# Patient Record
Sex: Male | Born: 1985 | Race: White | Hispanic: No | Marital: Married | Smoking: Never smoker
Health system: Southern US, Community
[De-identification: ages and names within clinical notes are randomized; demographics above are authoritative.]

## PROBLEM LIST (undated history)

## (undated) DIAGNOSIS — M1990 Unspecified osteoarthritis, unspecified site: Secondary | ICD-10-CM

## (undated) DIAGNOSIS — K5190 Ulcerative colitis, unspecified, without complications: Secondary | ICD-10-CM

## (undated) HISTORY — DX: Ulcerative colitis, unspecified, without complications: K51.90

## (undated) HISTORY — PX: ANKLE FRACTURE SURGERY: SHX122

## (undated) HISTORY — DX: Unspecified osteoarthritis, unspecified site: M19.90

---

## 2016-10-27 ENCOUNTER — Encounter: Payer: Self-pay | Admitting: Gastroenterology

## 2016-10-27 ENCOUNTER — Ambulatory Visit (INDEPENDENT_AMBULATORY_CARE_PROVIDER_SITE_OTHER): Payer: 59 | Admitting: Family

## 2016-10-27 ENCOUNTER — Other Ambulatory Visit (INDEPENDENT_AMBULATORY_CARE_PROVIDER_SITE_OTHER): Payer: 59

## 2016-10-27 ENCOUNTER — Encounter: Payer: Self-pay | Admitting: Family

## 2016-10-27 VITALS — BP 124/80 | HR 83 | Temp 98.3°F | Resp 16 | Ht 70.0 in | Wt 241.0 lb

## 2016-10-27 DIAGNOSIS — E559 Vitamin D deficiency, unspecified: Secondary | ICD-10-CM | POA: Diagnosis not present

## 2016-10-27 DIAGNOSIS — K5190 Ulcerative colitis, unspecified, without complications: Secondary | ICD-10-CM

## 2016-10-27 LAB — VITAMIN D 25 HYDROXY (VIT D DEFICIENCY, FRACTURES): VITD: 40.34 ng/mL (ref 30.00–100.00)

## 2016-10-27 LAB — COMPREHENSIVE METABOLIC PANEL
ALT: 20 U/L (ref 0–53)
AST: 17 U/L (ref 0–37)
Albumin: 4.4 g/dL (ref 3.5–5.2)
Alkaline Phosphatase: 65 U/L (ref 39–117)
BUN: 12 mg/dL (ref 6–23)
CALCIUM: 9.5 mg/dL (ref 8.4–10.5)
CHLORIDE: 103 meq/L (ref 96–112)
CO2: 29 meq/L (ref 19–32)
CREATININE: 0.95 mg/dL (ref 0.40–1.50)
GFR: 96.72 mL/min (ref >60.00)
GLUCOSE: 94 mg/dL (ref 70–99)
Potassium: 3.9 mEq/L (ref 3.5–5.1)
SODIUM: 138 meq/L (ref 135–145)
Total Bilirubin: 0.5 mg/dL (ref 0.2–1.2)
Total Protein: 7.3 g/dL (ref 6.0–8.3)

## 2016-10-27 MED ORDER — MESALAMINE 1.2 G PO TBEC: 2.4000 g | DELAYED_RELEASE_TABLET | Freq: Every day | ORAL | 0 refills | Status: DC

## 2016-10-27 MED FILL — LIALDA 1.2 GM TABLET SA: 1.2 | 90 days supply | Qty: 180 | Fill #0

## 2016-10-27 NOTE — Progress Notes (Signed)
Subjective:    Patient ID: Alan Jimenez, male    DOB: Nov 19, 1985, 31 y.o.   MRN: [id number]  Chief Complaint  Patient presents with  . Establish Care    Ulcerative colitis needs medication refilled for lialda, referral to GI    HPI:  Alan Jimenez is a 31 y.o. male who  has a past medical history of Arthritis and Ulcerative colitis ([place]). and presents today for an office visit to establish care.   Ulcerative colitis - Diagnosed in 2013 when having weight loss and bloody diarrhea for about 6 months when diagnosed via colonoscopy. No recent flares or current symptoms. Initially started on Humeria which was working well, but was switched to [place] secondary to his arthritis located in his costovertebral joints. Currently maintained on Lialda and reports taking the medication as prescribed and denies adverse side effects. Has also had borderline Vitamin D levels. 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 No current nausea, vomiting, diarrhea, constipation or blood in stool.    No Known Allergies    No outpatient prescriptions prior to visit.   No facility-administered medications prior to visit.      Past Medical History:  Diagnosis Date  . Arthritis   . Ulcerative colitis [facility])       Past Surgical History:  Procedure Laterality Date  . ANKLE FRACTURE SURGERY        Family History  Problem Relation Age of Onset  . Healthy Mother   . Healthy Father   . Healthy Maternal Grandmother   . Healthy Maternal Grandfather   . Healthy Paternal Grandmother   . Pulmonary fibrosis Paternal Grandfather       Social History   Social History  . Marital status: Married    Spouse name: N/A  . Number of children: 2  . Years of education: 71   Occupational History  . Internal Medicine Resident    Social History Main Topics  . Smoking status: Never Smoker  . Smokeless tobacco: Never Used  .  Alcohol use No  . Drug use: No  . Sexual activity: Yes   Other Topics Concern  . Not on file   Social History Narrative   Fun/Hobby: Hanging out with the kids; recently started running.      Review of Systems  Constitutional: Negative for chills and fever.  Respiratory: Negative for chest tightness, shortness of breath and wheezing.   Gastrointestinal: Negative for abdominal distention, abdominal pain, anal bleeding, blood in stool, constipation, diarrhea, nausea, rectal pain and vomiting.       Objective:    BP 124/80 (BP Location: Left Arm, Patient Position: Sitting, Cuff Size: Large)   Pulse 83   Temp 98.3 F (36.8 C) (Oral)   Resp 16   Ht 5' 10"  (1.778 m)   Wt 241 lb (109.3 kg)   SpO2 98%   BMI 34.58 kg/m  Nursing note and vital signs reviewed.  Physical Exam  Constitutional: He is oriented to person, place, and time. He appears well-developed and well-nourished. No distress.  Cardiovascular: Normal rate, regular rhythm, normal heart sounds and intact distal pulses.   Pulmonary/Chest: Effort normal and breath sounds normal.  Abdominal: Soft. Bowel sounds are normal. He exhibits no distension and no mass. There is no tenderness. There is no rebound and no guarding.  Neurological: He is alert and oriented to person, place, and time.  Skin: Skin is warm and dry.  Psychiatric: He has a normal mood and affect. His behavior is normal. Judgment and thought content normal.        Assessment & Plan:   Problem List Items Addressed This Visit      Digestive   Ulcerative colitis ([place]) - Primary    Previously diagnosed with ulcerative colitis and currently maintained on mesalamine with no adverse side effects. No symptoms of exacerbation. Refer to gastroenterology for further maintenance. Continue current dosage of mesalamine. Follow-up as needed for flares.      Relevant Orders   Comprehensive metabolic panel (Completed)   Ambulatory referral to Gastroenterology      Other   Vitamin D deficiency    Previously diagnosed with vitamin D deficiency and currently maintained on oral vitamin D supplementation. Obtain vitamin D levels. Continue current supplementation pending blood work results.      Relevant Orders   VITAMIN D 25 Hydroxy (Vit-D Deficiency, Fractures) (Completed)       I have changed Mr. Alan Jimenez's mesalamine.   Meds ordered this encounter  Medications  . DISCONTD: mesalamine (LIALDA) 1.2 g EC tablet    Sig: Take 2.4 g by mouth daily with breakfast.   . mesalamine (LIALDA) 1.2 g EC tablet    Sig: Take 2 tablets (2.4 g total) by mouth daily.    Dispense:  180 tablet    Refill:  0    Order Specific Question:   Supervising Provider    Answer:   Pricilla Holm A [[id number]]     Follow-up: Return in about 6 months (around 04/29/2017), or if symptoms worsen or fail to improve.  Mauricio Po, FNP

## 2016-10-27 NOTE — Assessment & Plan Note (Signed)
Previously diagnosed with vitamin D deficiency and currently maintained on oral vitamin D supplementation. Obtain vitamin D levels. Continue current supplementation pending blood work results.

## 2016-10-27 NOTE — Assessment & Plan Note (Signed)
Previously diagnosed with ulcerative colitis and currently maintained on mesalamine with no adverse side effects. No symptoms of exacerbation. Refer to gastroenterology for further maintenance. Continue current dosage of mesalamine. Follow-up as needed for flares.

## 2016-10-27 NOTE — Patient Instructions (Addendum)
Thank you for choosing [facility].  SUMMARY AND INSTRUCTIONS:  Welcome to the area!  Please continue to take your medications as prescribed.  You will receive a call to schedule your appointment with gastroenterology.  Schedule at time for your physical at your convenience.   Medication:  Your prescription(s) have been submitted to your pharmacy or been printed and provided for you. Please take as directed and contact our office if you believe you are having problem(s) with the medication(s) or have any questions.  Labs:  Please stop by the lab on the lower level of the building for your blood work. Your results will be released to [place] (or called to you) after review, usually within 72 hours after test completion. If any changes need to be made, you will be notified at that same time.  1.) The lab is open from 7:30am to 5:30 pm Monday-Friday 2.) No appointment is necessary 3.) Fasting (if needed) is 6-8 hours after food and drink; black coffee and water are okay   Follow up:  If your symptoms worsen or fail to improve, please contact our office for further instruction, or in case of emergency go directly to the emergency room at the closest medical facility.

## 2016-11-10 ENCOUNTER — Ambulatory Visit: Payer: Self-pay | Admitting: Family Medicine

## 2016-12-14 ENCOUNTER — Telehealth: Payer: Self-pay | Admitting: Gastroenterology

## 2016-12-14 NOTE — Telephone Encounter (Signed)
ROI faxed to [facility]

## 2017-01-03 ENCOUNTER — Ambulatory Visit (INDEPENDENT_AMBULATORY_CARE_PROVIDER_SITE_OTHER): Payer: 59 | Admitting: Gastroenterology

## 2017-01-03 ENCOUNTER — Encounter: Payer: Self-pay | Admitting: Gastroenterology

## 2017-01-03 VITALS — BP 128/86 | Ht 70.0 in | Wt 241.0 lb

## 2017-01-03 DIAGNOSIS — K5100 Ulcerative (chronic) pancolitis without complications: Secondary | ICD-10-CM | POA: Diagnosis not present

## 2017-01-03 MED ORDER — MESALAMINE 1.2 G PO TBEC: 2.4000 g | DELAYED_RELEASE_TABLET | Freq: Every day | ORAL | 5 refills | Status: DC

## 2017-01-03 NOTE — Patient Instructions (Addendum)
The orders for your lab work is in our system. If you decide to have them drawn, please go directly to our basement at your convenience.   We have sent the following medications to your pharmacy for you to pick up at your convenience: [facility].  Dr. Amil Amen and Dr. Max Sane [facility] [place]. [address] [place], [place] [address] Phone: [phone] Fax: [phone]  Thank you for choosing me and [facility].  Pricilla Riffle. Dagoberto Ligas., MD., Marval Regal

## 2017-01-03 NOTE — Progress Notes (Signed)
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 Unfortunately I don't have prior medical records. He states he was diagnosed with UC involving most of his colon in December 2013. His symptoms began in May 2013 and included bloody diarrhea weight loss lower abdominal pain. He is also lactose intolerant and states he has several other food intolerances that lead to diarrhea. He states he was treated with Humira for about 2 years and then [facility] for one year. 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 He has been maintained on Lialda. Biologics were prescribed by his rheumatologist in conjunction with his gastroenterologist primarily for spine related arthritis symptoms. States he has sacroiliitis and his HLA-B27 was negative. States his only colonoscopy was performed in December 2013 at the time of diagnosis. Denies weight loss, abdominal pain, constipation, diarrhea, change in stool caliber, melena, hematochezia, nausea, vomiting, dysphagia, reflux symptoms, chest pain.   Allergies  Allergen Reactions  . Penicillins Rash    As child   Outpatient Medications Prior to Visit  Medication Sig Dispense Refill  . mesalamine (LIALDA) 1.2 g EC tablet Take 2 tablets (2.4 g total) by mouth daily. 180 tablet 0   No facility-administered medications prior to visit.    Past Medical History:  Diagnosis Date  . Arthritis   . Ulcerative colitis [facility])    Past Surgical History:  Procedure Laterality Date  . ANKLE FRACTURE SURGERY     Social History   Social History  . Marital status: Married    Spouse name: N/A  . Number of children: 2  . Years of education: 73   Occupational History  . Internal Medicine Resident    Social History Main Topics  . Smoking status: Never Smoker  . Smokeless tobacco: Never Used  . Alcohol use No  .  Drug use: No  . Sexual activity: Yes   Other Topics Concern  . None   Social History Narrative   Fun/Hobby: Hanging out with the kids; recently started running.    Family History  Problem Relation Age of Onset  . Healthy Mother   . Healthy Father   . Healthy Maternal Grandmother   . Healthy Maternal Grandfather   . Healthy Paternal Grandmother   . Pulmonary fibrosis Paternal Grandfather       Review of Systems: Pertinent positive and negative review of systems were noted in the above HPI section. All other review of systems were otherwise negative.    Physical Exam: General: Well developed, well nourished, no acute distress Head: Normocephalic and atraumatic Eyes:  sclerae anicteric, EOMI Ears: Normal auditory acuity Mouth: No deformity or lesions Neck: Supple, no masses or thyromegaly Lungs: Clear throughout to auscultation Heart: Regular rate and rhythm; no murmurs, rubs or bruits Abdomen: Soft, non tender and non distended. No masses, hepatosplenomegaly or hernias noted. Normal Bowel sounds Rectal: not done Musculoskeletal: Symmetrical with no gross deformities  Skin: No lesions on visible extremities Pulses:  Normal pulses noted Extremities: No clubbing, cyanosis, edema or deformities noted Neurological: Alert oriented x 4, grossly nonfocal Cervical Nodes:  No significant cervical adenopathy Inguinal Nodes: No significant inguinal adenopathy Psychological:  Alert and cooperative. Normal mood and affect  Assessment and Recommendations:  1. Ulcerative colitis diagnosed in 2013, pancolitis by his history. Symptoms currently controlled on Lialda. Renew Lialda 2.4 g daily. CMP  in Aug 2018 was normal. Recommended CBC, ESR and CRP and he will check his insurance for coverage before obtaining. Advised to obtain tTG and IgA if this has not been done in the past few years and will await review of records before ordering. Recommended that he obtain rheumatologist in [place]  and he will consider this. Provided contact information for Drs. Amil Amen and [place]. I recommended colonoscopy to assess disease activity and he will consider this. Advised to call for any flare of symptoms. REV in 6 months.   2. Lactose intolerance and other food intolerances. Continue current avoidance strategy.   cc: [facility], [place] [address] [place], [place] [address]

## 2017-02-14 MED FILL — LIALDA 1.2 GM TABLET SA: 1.2 | 90 days supply | Qty: 180 | Fill #0

## 2017-02-21 NOTE — Telephone Encounter (Signed)
No records from [place]

## 2017-04-19 ENCOUNTER — Telehealth: Payer: Self-pay | Admitting: Pulmonary Disease

## 2017-04-19 MED ORDER — CIPROFLOXACIN HCL 500 MG PO TABS: 500.0000 mg | ORAL_TABLET | Freq: Two times a day (BID) | ORAL | 0 refills | Status: DC

## 2017-04-19 MED FILL — CIPROFLOXACIN HCL 500 MG TA: 500 | 2 days supply | Qty: 4 | Fill #0

## 2017-04-19 NOTE — Telephone Encounter (Signed)
Involved in care of patient with possible meningococcal meningitis.  Denies current symptoms of cough, headache, fever, rash, dyspnea, abdominal pain, chest pain.  Will send script for cipro for prophylaxis.

## 2017-05-04 MED FILL — LIALDA 1.2 GM TABLET SA: 1.2 | 90 days supply | Qty: 180 | Fill #1

## 2017-07-26 ENCOUNTER — Ambulatory Visit (INDEPENDENT_AMBULATORY_CARE_PROVIDER_SITE_OTHER): Payer: 59 | Admitting: Gastroenterology

## 2017-07-26 ENCOUNTER — Encounter: Payer: Self-pay | Admitting: Gastroenterology

## 2017-07-26 ENCOUNTER — Other Ambulatory Visit (INDEPENDENT_AMBULATORY_CARE_PROVIDER_SITE_OTHER): Payer: 59

## 2017-07-26 ENCOUNTER — Encounter

## 2017-07-26 VITALS — BP 136/86 | HR 88 | Ht 70.0 in | Wt 235.8 lb

## 2017-07-26 DIAGNOSIS — K5100 Ulcerative (chronic) pancolitis without complications: Secondary | ICD-10-CM

## 2017-07-26 LAB — COMPREHENSIVE METABOLIC PANEL
ALBUMIN: 4.3 g/dL (ref 3.5–5.2)
ALT: 20 U/L (ref 0–53)
AST: 17 U/L (ref 0–37)
Alkaline Phosphatase: 91 U/L (ref 39–117)
BUN: 15 mg/dL (ref 6–23)
CALCIUM: 9.2 mg/dL (ref 8.4–10.5)
CO2: 27 mEq/L (ref 19–32)
Chloride: 103 mEq/L (ref 96–112)
Creatinine, Ser: 1.12 mg/dL (ref 0.40–1.50)
GFR: 79.63 mL/min (ref >60.00)
Glucose, Bld: 88 mg/dL (ref 70–99)
POTASSIUM: 3.8 meq/L (ref 3.5–5.1)
SODIUM: 140 meq/L (ref 135–145)
Total Bilirubin: 0.4 mg/dL (ref 0.2–1.2)
Total Protein: 7.1 g/dL (ref 6.0–8.3)

## 2017-07-26 LAB — CBC WITH DIFFERENTIAL/PLATELET
BASOS ABS: 0.1 10*3/uL (ref 0.0–0.1)
Basophils Relative: 0.8 % (ref 0.0–3.0)
EOS ABS: 0.1 10*3/uL (ref 0.0–0.7)
EOS PCT: 2.2 % (ref 0.0–5.0)
HCT: 40.3 % (ref 39.0–52.0)
HEMOGLOBIN: 14 g/dL (ref 13.0–17.0)
Lymphocytes Relative: 24.5 % (ref 12.0–46.0)
Lymphs Abs: 1.6 10*3/uL (ref 0.7–4.0)
MCHC: 34.8 g/dL (ref 30.0–36.0)
MCV: 81.9 fl (ref 78.0–100.0)
MONO ABS: 0.5 10*3/uL (ref 0.1–1.0)
Monocytes Relative: 8.4 % (ref 3.0–12.0)
NEUTROS PCT: 64.1 % (ref 43.0–77.0)
Neutro Abs: 4.1 10*3/uL (ref 1.4–7.7)
Platelets: 228 10*3/uL (ref 150.0–400.0)
RBC: 4.91 Mil/uL (ref 4.22–5.81)
RDW: 13.8 % (ref 11.5–15.5)
WBC: 6.4 10*3/uL (ref 4.0–10.5)

## 2017-07-26 LAB — SEDIMENTATION RATE: SED RATE: 13 mm/h (ref 0–15)

## 2017-07-26 LAB — C-REACTIVE PROTEIN: CRP: 0.4 mg/dL — AB (ref 0.5–20.0)

## 2017-07-26 NOTE — Patient Instructions (Addendum)
Your provider has requested that you go to the basement level for lab work before leaving today. Press "B" on the elevator. The lab is located at the first door on the left as you exit the elevator.   We will fax records to there office and they will contact you with an appointment date and time.  Dr. Amil Amen and Dr. Max Sane [facility] [place]. [address] [place], [place] [address] Phone: [phone] Fax: [phone]  Normal BMI (Body Mass Index- based on height and weight) is between 19 and 25. Your BMI today is Body mass index is 33.83 kg/m. Marland Kitchen Please consider follow up  regarding your BMI with your Primary Care Provider.  Thank you for choosing me and [facility].  Pricilla Riffle. Dagoberto Ligas., MD., Marval Regal

## 2017-07-26 NOTE — Progress Notes (Signed)
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  He was diagnosed in [place], [place] with UC in December 2014.  Pathology showed moderate to severe colitis c/w IBD favoring UC. Symptoms began in May 2014.  He was treated with Humira initially for about 2 years and then Simponi for about 1 year.  His colitis symptoms have been under excellent control.  He has no gastrointestinal complaints.  His only complaints are of low back pain right greater than left.  Symptoms do not respond fully to NSAIDs.  He wants to pursue rheumatology evaluation.  Current Medications, Allergies, Past Medical History, Past Surgical History, Family History and Social History were reviewed in [facility] record.  Physical Exam: General: Well developed, well nourished, no acute distress Head: Normocephalic and atraumatic Eyes:  sclerae anicteric, EOMI Ears: Normal auditory acuity Mouth: No deformity or lesions Lungs: Clear throughout to auscultation Heart: Regular rate and rhythm; no murmurs, rubs or bruits Abdomen: Soft, non tender and non distended. No masses, hepatosplenomegaly or hernias noted. Normal Bowel sounds Musculoskeletal: Symmetrical with no gross deformities  Pulses:  Normal pulses noted Extremities: No clubbing, cyanosis, edema or deformities noted Neurological: Alert oriented x 4, grossly nonfocal Psychological:  Alert and cooperative. Normal mood and affect  Assessment and Recommendations:  1.  Ulcerative colitis, pancolitis by history, diagnosed in 2014.  Prior treatment with Humira and Simponi.  Low back pain currently only complaint. Continue Lialda 2.4 g po qd. CBC, CMP, ESR, CRP today. Rheumatology referral. REV in 6 months.

## 2017-07-27 MED FILL — LIALDA 1.2 GM TABLET SA: 1.2 | 90 days supply | Qty: 180 | Fill #2

## 2017-07-28 ENCOUNTER — Telehealth: Payer: Self-pay | Admitting: Gastroenterology

## 2017-07-28 NOTE — Telephone Encounter (Signed)
Routed to [facility], Dr. Fuller Plan patient.

## 2017-07-28 NOTE — Telephone Encounter (Signed)
Alan Jimenez from Rheumatology office called stating that she received a referral for pt but is missing her demographics. Pls fax it to [phone], attn. Alan Jimenez.

## 2017-07-28 NOTE — Telephone Encounter (Signed)
Requested demos faxed

## 2017-08-09 NOTE — Telephone Encounter (Signed)
Referral scheduled at [facility] on 09/20/17 at 2:00pm with Gavin Pound, MD. Per their office patient has been notified of appt date and time.

## 2017-08-10 ENCOUNTER — Encounter: Payer: Self-pay | Admitting: Family

## 2017-08-12 ENCOUNTER — Encounter: Payer: Self-pay | Admitting: Family Medicine

## 2017-08-30 ENCOUNTER — Encounter: Payer: Self-pay | Admitting: Family Medicine

## 2017-08-30 ENCOUNTER — Ambulatory Visit (INDEPENDENT_AMBULATORY_CARE_PROVIDER_SITE_OTHER): Payer: 59 | Admitting: Family Medicine

## 2017-08-30 DIAGNOSIS — E669 Obesity, unspecified: Secondary | ICD-10-CM | POA: Diagnosis not present

## 2017-08-30 NOTE — Patient Instructions (Signed)
It was very nice to see you today!  Keep up the good work!!  Come back to see me in 1 year, or sooner as needed.  Take care, Dr Jerline Pain   Preventive Care 18-39 Years, Male Preventive care refers to lifestyle choices and visits with your health care provider that can promote health and wellness. What does preventive care include?  A yearly physical exam. This is also called an annual well check.  Dental exams once or twice a year.  Routine eye exams. Ask your health care provider how often you should have your eyes checked.  Personal lifestyle choices, including: ? Daily care of your teeth and gums. ? Regular physical activity. ? Eating a healthy diet. ? Avoiding tobacco and drug use. ? Limiting alcohol use. ? Practicing safe sex. What happens during an annual well check? The services and screenings done by your health care provider during your annual well check will depend on your age, overall health, lifestyle risk factors, and family history of disease. Counseling Your health care provider may ask you questions about your:  Alcohol use.  Tobacco use.  Drug use.  Emotional well-being.  Home and relationship well-being.  Sexual activity.  Eating habits.  Work and work Statistician.  Screening You may have the following tests or measurements:  Height, weight, and BMI.  Blood pressure.  Lipid and cholesterol levels. These may be checked every 5 years starting at age 69.  Diabetes screening. This is done by checking your blood sugar (glucose) after you have not eaten for a while (fasting).  Skin check.  Hepatitis C blood test.  Hepatitis B blood test.  Sexually transmitted disease (STD) testing.  Discuss your test results, treatment options, and if necessary, the need for more tests with your health care provider. Vaccines Your health care provider may recommend certain vaccines, such as:  Influenza vaccine. This is recommended every year.  Tetanus,  diphtheria, and acellular pertussis (Tdap, Td) vaccine. You may need a Td booster every 10 years.  Varicella vaccine. You may need this if you have not been vaccinated.  HPV vaccine. If you are 63 or younger, you may need three doses over 6 months.  Measles, mumps, and rubella (MMR) vaccine. You may need at least one dose of MMR.You may also need a second dose.  Pneumococcal 13-valent conjugate (PCV13) vaccine. You may need this if you have certain conditions and have not been vaccinated.  Pneumococcal polysaccharide (PPSV23) vaccine. You may need one or two doses if you smoke cigarettes or if you have certain conditions.  Meningococcal vaccine. One dose is recommended if you are age 36-21 years and a first-year college student living in a residence hall, or if you have one of several medical conditions. You may also need additional booster doses.  Hepatitis A vaccine. You may need this if you have certain conditions or if you travel or work in places where you may be exposed to hepatitis A.  Hepatitis B vaccine. You may need this if you have certain conditions or if you travel or work in places where you may be exposed to hepatitis B.  Haemophilus influenzae type b (Hib) vaccine. You may need this if you have certain risk factors.  Talk to your health care provider about which screenings and vaccines you need and how often you need them. This information is not intended to replace advice given to you by your health care provider. Make sure you discuss any questions you have with your health  care provider. Document Released: 05/11/2001 Document Revised: 12/03/2015 Document Reviewed: 01/14/2015 Elsevier Interactive Patient Education  [employer].

## 2017-08-30 NOTE — Progress Notes (Signed)
Subjective:  Alan Jimenez is a 32 y.o. male who presents today for his annual comprehensive physical exam.    HPI:   He has no acute complaints today.   Lifestyle Diet: Restricted diet due to UC.  Exercise: Three times weekly as much as he can.   Depression screen PHQ 2/9 10/27/2016  Decreased Interest 0  Down, Depressed, Hopeless 0  PHQ - 2 Score 0    Health Maintenance Due  Topic Date Due  . HIV Screening  04/08/1998    ROS: Per HPI, otherwise a complete review of systems was negative.   PMH:  The following were reviewed and entered/updated in epic: Past Medical History:  Diagnosis Date  . Arthritis   . Ulcerative colitis [facility])    Patient Active Problem List   Diagnosis Date Noted  . Obesity 08/30/2017  . Ulcerative colitis ([place]) 10/27/2016  . Vitamin D deficiency 10/27/2016   Past Surgical History:  Procedure Laterality Date  . ANKLE FRACTURE SURGERY Left    Family History  Problem Relation Age of Onset  . Healthy Mother   . Healthy Father   . Healthy Maternal Grandmother   . Healthy Maternal Grandfather   . Healthy Paternal Grandmother   . Pulmonary fibrosis Paternal Grandfather   . Colon cancer Neg Hx   . Esophageal cancer Neg Hx   . Pancreatic cancer Neg Hx   . Stomach cancer Neg Hx   . Liver disease Neg Hx    Medications- reviewed and updated Current Outpatient Medications  Medication Sig Dispense Refill  . mesalamine (LIALDA) 1.2 g EC tablet Take 2 tablets (2.4 g total) by mouth daily. 180 tablet 5   No current facility-administered medications for this visit.     Allergies-reviewed and updated Allergies  Allergen Reactions  . Penicillins Rash    As child    Social History   Socioeconomic History  . Marital status: Married    Spouse name: Not on file  . Number of children: 2  . Years of education: 71  . Highest education level: Not on file  Occupational History  . Occupation: Internal Medicine Resident  Social Needs    . Financial resource strain: Not on file  . Food insecurity:    Worry: Not on file    Inability: Not on file  . Transportation needs:    Medical: Not on file    Non-medical: Not on file  Tobacco Use  . Smoking status: Never Smoker  . Smokeless tobacco: Never Used  Substance and Sexual Activity  . Alcohol use: No  . Drug use: No  . Sexual activity: Yes    Partners: Female  Lifestyle  . Physical activity:    Days per week: Not on file    Minutes per session: Not on file  . Stress: Not on file  Relationships  . Social connections:    Talks on phone: Not on file    Gets together: Not on file    Attends religious service: Not on file    Active member of club or organization: Not on file    Attends meetings of clubs or organizations: Not on file    Relationship status: Not on file  Other Topics Concern  . Not on file  Social History Narrative   Fun/Hobby: Hanging out with the kids; recently started running.    Objective:  Physical Exam: BP 132/80 (BP Location: Left Arm, Patient Position: Sitting, Cuff Size: Normal)   Pulse 97  Temp 98.4 F (36.9 C) (Oral)   Ht 5' 10"  (1.778 m)   Wt 236 lb 3.2 oz (107.1 kg)   SpO2 97%   BMI 33.89 kg/m   Body mass index is 33.89 kg/m. Wt Readings from Last 3 Encounters:  08/30/17 236 lb 3.2 oz (107.1 kg)  07/26/17 235 lb 12.8 oz (107 kg)  01/03/17 241 lb (109.3 kg)   Gen: NAD, resting comfortably open care teams HEENT: TMs normal bilaterally. OP clear. No thyromegaly noted.  CV: RRR with no murmurs appreciated Pulm: NWOB, CTAB with no crackles, wheezes, or rhonchi GI: Normal bowel sounds present. Soft, Nontender, Nondistended. MSK: no edema, cyanosis, or clubbing noted Skin: warm, dry Neuro: CN2-12 grossly intact. Strength 5/5 in upper and lower extremities. Reflexes symmetric and intact bilaterally.  Psych: Normal affect and thought content  Assessment/Plan:  Obesity Congratulated patient on recent weight loss.  Encouraged  continued lifestyle modifications.  Follow-up in 1 year.  Preventative Healthcare: Up-to-date on screenings and vaccinations.  Patient Counseling(The following topics were reviewed and/or handout was given):  -Nutrition: Stressed importance of moderation in sodium/caffeine intake, saturated fat and cholesterol, caloric balance, sufficient intake of fresh fruits, vegetables, and fiber.  -Stressed the importance of regular exercise.   -Substance Abuse: Discussed cessation/primary prevention of tobacco, alcohol, or other drug use; driving or other dangerous activities under the influence; availability of treatment for abuse.   -Injury prevention: Discussed safety belts, safety helmets, smoke detector, smoking near bedding or upholstery.   -Sexuality: Discussed sexually transmitted diseases, partner selection, use of condoms, avoidance of unintended pregnancy and contraceptive alternatives.   -Dental health: Discussed importance of regular tooth brushing, flossing, and dental visits.  -Health maintenance and immunizations reviewed. Please refer to Health maintenance section.  Return to care in 1 year for next preventative visit.   Algis Greenhouse. Jerline Pain, MD 08/30/2017 3:43 PM

## 2017-08-30 NOTE — Assessment & Plan Note (Signed)
Congratulated patient on recent weight loss.  Encouraged continued lifestyle modifications.  Follow-up in 1 year.

## 2017-09-13 ENCOUNTER — Encounter: Payer: 59 | Admitting: Family Medicine

## 2017-09-20 ENCOUNTER — Other Ambulatory Visit: Payer: Self-pay | Admitting: Internal Medicine

## 2017-09-20 DIAGNOSIS — K5190 Ulcerative colitis, unspecified, without complications: Secondary | ICD-10-CM | POA: Diagnosis not present

## 2017-09-20 DIAGNOSIS — E669 Obesity, unspecified: Secondary | ICD-10-CM | POA: Diagnosis not present

## 2017-09-20 DIAGNOSIS — Z6835 Body mass index (BMI) 35.0-35.9, adult: Secondary | ICD-10-CM | POA: Diagnosis not present

## 2017-09-20 DIAGNOSIS — M459 Ankylosing spondylitis of unspecified sites in spine: Secondary | ICD-10-CM | POA: Diagnosis not present

## 2017-09-20 DIAGNOSIS — M461 Sacroiliitis, not elsewhere classified: Secondary | ICD-10-CM | POA: Diagnosis not present

## 2017-09-20 DIAGNOSIS — M940 Chondrocostal junction syndrome [Tietze]: Secondary | ICD-10-CM | POA: Diagnosis not present

## 2017-09-20 DIAGNOSIS — R5383 Other fatigue: Secondary | ICD-10-CM | POA: Diagnosis not present

## 2017-09-20 MED ORDER — ADALIMUMAB 40 MG/0.4ML ~~LOC~~ AJKT: 40.0000 mg | AUTO-INJECTOR | SUBCUTANEOUS | 6 refills | Status: DC

## 2017-09-23 MED FILL — HUMIRA PEN 40 MG/0.4ML PNKT: 40 | 28 days supply | Qty: 2 | Fill #0

## 2017-11-03 MED FILL — HUMIRA PEN 40 MG/0.4ML PNKT: 40 | 28 days supply | Qty: 2 | Fill #1

## 2017-11-03 MED FILL — LIALDA 1.2 GM TABLET SA: 1.2 | 90 days supply | Qty: 180 | Fill #3

## 2017-12-07 MED FILL — HUMIRA PEN 40 MG/0.4ML PNKT: 40 | 28 days supply | Qty: 2 | Fill #2

## 2017-12-30 MED FILL — HUMIRA PEN 40 MG/0.4ML PNKT: 40 | 28 days supply | Qty: 2 | Fill #3

## 2018-01-30 ENCOUNTER — Telehealth: Payer: Self-pay | Admitting: Gastroenterology

## 2018-01-30 ENCOUNTER — Other Ambulatory Visit: Payer: Self-pay | Admitting: Gastroenterology

## 2018-01-30 MED ORDER — MESALAMINE 1.2 G PO TBEC: 2.4000 g | DELAYED_RELEASE_TABLET | Freq: Every day | ORAL | 0 refills | Status: DC

## 2018-01-30 MED FILL — HUMIRA PEN 40 MG/0.4ML PNKT: 40 | 28 days supply | Qty: 2 | Fill #4

## 2018-01-30 MED FILL — LIALDA 1.2 GM TABLET SA: 1.2 | 90 days supply | Qty: 180 | Fill #0

## 2018-01-30 NOTE — Telephone Encounter (Signed)
Prescription sent to patient's pharmacy and patient notified to keep appt for further refills.

## 2018-01-31 DIAGNOSIS — M940 Chondrocostal junction syndrome [Tietze]: Secondary | ICD-10-CM | POA: Diagnosis not present

## 2018-01-31 DIAGNOSIS — Z79899 Other long term (current) drug therapy: Secondary | ICD-10-CM | POA: Diagnosis not present

## 2018-01-31 DIAGNOSIS — M459 Ankylosing spondylitis of unspecified sites in spine: Secondary | ICD-10-CM | POA: Diagnosis not present

## 2018-01-31 DIAGNOSIS — Z6834 Body mass index (BMI) 34.0-34.9, adult: Secondary | ICD-10-CM | POA: Diagnosis not present

## 2018-01-31 DIAGNOSIS — K5190 Ulcerative colitis, unspecified, without complications: Secondary | ICD-10-CM | POA: Diagnosis not present

## 2018-01-31 DIAGNOSIS — M461 Sacroiliitis, not elsewhere classified: Secondary | ICD-10-CM | POA: Diagnosis not present

## 2018-01-31 DIAGNOSIS — E669 Obesity, unspecified: Secondary | ICD-10-CM | POA: Diagnosis not present

## 2018-02-27 MED FILL — HUMIRA PEN 40 MG/0.4ML PNKT: 40 | 28 days supply | Qty: 2 | Fill #5

## 2018-03-15 ENCOUNTER — Ambulatory Visit: Payer: 59 | Admitting: Gastroenterology

## 2018-03-16 DIAGNOSIS — R7989 Other specified abnormal findings of blood chemistry: Secondary | ICD-10-CM | POA: Diagnosis not present

## 2018-04-05 MED FILL — HUMIRA PEN 40 MG/0.4ML PNKT: 40 | 28 days supply | Qty: 2 | Fill #6

## 2018-05-03 ENCOUNTER — Telehealth: Payer: Self-pay | Admitting: Pharmacist

## 2018-05-03 MED FILL — HUMIRA PEN 40 MG/0.4ML PNKT: 40 | 28 days supply | Qty: 2 | Fill #0

## 2018-05-03 NOTE — Telephone Encounter (Signed)
Called patient to schedule an appointment for the [facility]. Patient is interested in video visit so I will send him the instructions to schedule the visit.

## 2018-06-05 MED FILL — HUMIRA PEN 40 MG/0.4ML PNKT: 40 | 28 days supply | Qty: 2 | Fill #1

## 2018-06-06 ENCOUNTER — Ambulatory Visit (INDEPENDENT_AMBULATORY_CARE_PROVIDER_SITE_OTHER): Payer: 59 | Admitting: Pharmacist

## 2018-06-06 DIAGNOSIS — Z79899 Other long term (current) drug therapy: Secondary | ICD-10-CM

## 2018-06-06 MED ORDER — ADALIMUMAB 40 MG/0.4ML ~~LOC~~ AJKT: 40.0000 mg | AUTO-INJECTOR | SUBCUTANEOUS | 2 refills | Status: DC

## 2018-06-06 NOTE — Progress Notes (Signed)
   S: Patient is currently taking Humira for ulcerative colitis and ankylosing spondylitis. Patient is managed by Gavin Pound for this.   Adherence: denies any missed doses  Efficacy: works very well for him.  Dosing:   Ulcerative colitis: SubQ (may continue aminosalicylates and/or corticosteroids; if necessary, azathioprine, or mercaptopurine may also be continued): Initial: 160 mg (given as four 40 mg injections on day 1 or given as two 40 mg injections per day over 2 consecutive days), then 80 mg 2 weeks later (day 15). Maintenance: 40 mg every other week beginning day 29. Note: Only continue maintenance dose in patients demonstrating clinical remission by 8 weeks (day 57) of therapy.  Dose adjustments: Renal: no dose adjustments (has not been studied) Hepatic: no dose adjustments (has not been studied)  Screening: TB test: completed  Hepatitis: completed  Monitoring: S/sx of infection: denies CBC: followed by [facility], gets labs at every visit S/sx of hypersensitivity: denies S/sx of malignancy: denies S/sx of heart failure: denies  O:     Lab Results  Component Value Date   WBC 6.4 07/26/2017   HGB 14.0 07/26/2017   HCT 40.3 07/26/2017   MCV 81.9 07/26/2017   PLT 228.0 07/26/2017      Chemistry      Component Value Date/Time   NA 140 07/26/2017 1610   K 3.8 07/26/2017 1610   CL 103 07/26/2017 1610   CO2 27 07/26/2017 1610   BUN 15 07/26/2017 1610   CREATININE 1.12 07/26/2017 1610      Component Value Date/Time   CALCIUM 9.2 07/26/2017 1610   ALKPHOS 91 07/26/2017 1610   AST 17 07/26/2017 1610   ALT 20 07/26/2017 1610   BILITOT 0.4 07/26/2017 1610       A/P: 1. Medication review: Patient currently on Humira for ulcerative colitis and ankylosing spondylitis and is tolerating it well with improved control of both disease states. Reviewed all notes from [facility]. Reviewed the medication with the patient, including the  following: Humira is a TNF blocking agent indicated for ankylosing spondylitis, Crohn's disease, Hidradenitis suppurativa, psoriatic arthritis, plaque psoriasis, ulcerative colitis, and uveitis. Patient educated on purpose, proper use and potential adverse effects of Humira. The most common adverse effects are infections, headache, and injection site reactions. There is the possibility of an increased risk of malignancy but it is not well understood if this increased risk is due to there medication or the disease state. There are rare cases of pancytopenia and aplastic anemia. No recommendations for any changes at this time.   Christella Hartigan, PharmD, BCPS, BCACP, CPP Clinical Pharmacist Practitioner  [phone]

## 2018-08-08 ENCOUNTER — Encounter: Payer: Self-pay | Admitting: Family Medicine

## 2018-08-08 ENCOUNTER — Telehealth (INDEPENDENT_AMBULATORY_CARE_PROVIDER_SITE_OTHER): Payer: 59 | Admitting: Family Medicine

## 2018-08-08 VITALS — Wt 207.0 lb

## 2018-08-08 DIAGNOSIS — K5100 Ulcerative (chronic) pancolitis without complications: Secondary | ICD-10-CM

## 2018-08-08 DIAGNOSIS — R591 Generalized enlarged lymph nodes: Secondary | ICD-10-CM

## 2018-08-08 NOTE — Assessment & Plan Note (Addendum)
Continue Lialda per GI.  He would like to stay off Humira for the time being due to concerns for COVID-19.  He will follow-up with rheumatology soon to discuss when he could go back on this. Would complete above eval and rule out any other underlying infection or malignancy before starting back on humira.

## 2018-08-08 NOTE — Progress Notes (Signed)
    Chief Complaint:  Alan Jimenez is a 33 y.o. male who presents today for a virtual office visit with a chief complaint of lymphadenopathy.   Assessment/Plan:  Lymphadenopathy Given that symptoms been present for 2 months, I think it is reasonable to pursue further work-up at this point.  Will check CBC with differential.  Also check ultrasound of the area.  Ulcerative colitis ([place]) Continue Lialda per GI.  He would like to stay off Humira for the time being due to concerns for COVID-19.  He will follow-up with rheumatology soon to discuss when he could go back on this. Would complete above eval and rule out any other underlying infection or malignancy before starting back on humira.     Subjective:  HPI:  Lymphadenopathy Started about 2 months ago. Located in bilateral axilla but more so in the left. Has had some intentional weight loss over the last couple of months. No fevers, chills, body aches, or night sweats. No recent illness. He is on humira for UC and stopped that a couple of months ago due to [place] 19 pandemic. He has noticed 10-15 lumps in either axilla ranging in size from 1 to 1.5cm. They are not painful. No other obvious alleviating or aggravating factors.   UC As noted above has stopped humira temporarily due to concern for COVID 19. He is on liada daily and is doing well. Has upcoming appointment with rheumatology to discuss humira.   ROS: Per HPI  PMH: He reports that he has never smoked. He has never used smokeless tobacco. He reports that he does not drink alcohol or use drugs.      Objective/Observations  Physical Exam: Gen: NAD, resting comfortably Pulm: Normal work of breathing Neuro: Grossly normal, moves all extremities Psych: Normal affect and thought content  Virtual Visit via Video   I connected with Nicola Girt on 08/08/18 at  8:20 AM EDT by a video enabled telemedicine application and verified that I am speaking with the correct  person using two identifiers. I discussed the limitations of evaluation and management by telemedicine and the availability of in person appointments. The patient expressed understanding and agreed to proceed.   Patient location: Home Provider location: [place] participating in the virtual visit: Myself and Patient     Algis Greenhouse. Jerline Pain, MD 08/08/2018 10:52 AM

## 2018-08-09 ENCOUNTER — Other Ambulatory Visit (INDEPENDENT_AMBULATORY_CARE_PROVIDER_SITE_OTHER): Payer: 59

## 2018-08-09 DIAGNOSIS — R591 Generalized enlarged lymph nodes: Secondary | ICD-10-CM | POA: Diagnosis not present

## 2018-08-09 LAB — CBC WITH DIFFERENTIAL/PLATELET
Basophils Absolute: 0 10*3/uL (ref 0.0–0.1)
Basophils Relative: 0.7 % (ref 0.0–3.0)
Eosinophils Absolute: 0.1 10*3/uL (ref 0.0–0.7)
Eosinophils Relative: 1.7 % (ref 0.0–5.0)
HCT: 41.9 % (ref 39.0–52.0)
Hemoglobin: 14.4 g/dL (ref 13.0–17.0)
Lymphocytes Relative: 35 % (ref 12.0–46.0)
Lymphs Abs: 1.7 10*3/uL (ref 0.7–4.0)
MCHC: 34.5 g/dL (ref 30.0–36.0)
MCV: 86.3 fl (ref 78.0–100.0)
Monocytes Absolute: 0.4 10*3/uL (ref 0.1–1.0)
Monocytes Relative: 7.4 % (ref 3.0–12.0)
Neutro Abs: 2.7 10*3/uL (ref 1.4–7.7)
Neutrophils Relative %: 55.2 % (ref 43.0–77.0)
Platelets: 194 10*3/uL (ref 150.0–400.0)
RBC: 4.85 Mil/uL (ref 4.22–5.81)
RDW: 13.9 % (ref 11.5–15.5)
WBC: 4.9 10*3/uL (ref 4.0–10.5)

## 2018-08-10 ENCOUNTER — Other Ambulatory Visit: Payer: Self-pay | Admitting: Family Medicine

## 2018-08-10 DIAGNOSIS — R591 Generalized enlarged lymph nodes: Secondary | ICD-10-CM

## 2018-08-10 NOTE — Progress Notes (Signed)
Dr Marigene Ehlers interpretation of your lab work:  Good news! Your CBC with diff was completely normal. We will contact with results of your ultrasound once they are available.    If you have any additional questions, please give [place] a call or send [place] a message through [place].  Take care, Dr Jerline Pain

## 2018-08-18 ENCOUNTER — Other Ambulatory Visit: Payer: Self-pay | Admitting: Family Medicine

## 2018-08-18 ENCOUNTER — Ambulatory Visit
Admission: RE | Admit: 2018-08-18 | Discharge: 2018-08-18 | Disposition: A | Discharge status: Home / Self Care | Payer: 59 | Source: Ambulatory Visit | Attending: Family Medicine | Admitting: Family Medicine

## 2018-08-18 ENCOUNTER — Other Ambulatory Visit: Payer: Self-pay

## 2018-08-18 DIAGNOSIS — R591 Generalized enlarged lymph nodes: Secondary | ICD-10-CM

## 2018-08-18 DIAGNOSIS — N6489 Other specified disorders of breast: Secondary | ICD-10-CM | POA: Diagnosis not present

## 2018-08-19 NOTE — Progress Notes (Signed)
Please inform patient of the following:  His ultrasound was negative for swollen lymph nodes. Looks like the radiologist has already discussed this with him. Do not need to do any further imaging or testing at this time.   Alan Jimenez. Jerline Pain, MD 08/19/2018 9:26 AM

## 2018-08-24 DIAGNOSIS — Z79899 Other long term (current) drug therapy: Secondary | ICD-10-CM | POA: Diagnosis not present

## 2018-08-24 DIAGNOSIS — K5190 Ulcerative colitis, unspecified, without complications: Secondary | ICD-10-CM | POA: Diagnosis not present

## 2018-08-24 DIAGNOSIS — M459 Ankylosing spondylitis of unspecified sites in spine: Secondary | ICD-10-CM | POA: Diagnosis not present

## 2018-08-24 DIAGNOSIS — R7989 Other specified abnormal findings of blood chemistry: Secondary | ICD-10-CM | POA: Diagnosis not present

## 2018-08-28 MED FILL — HUMIRA PEN 40 MG/0.4ML PNKT: 40 | 28 days supply | Qty: 2 | Fill #0

## 2018-08-31 DIAGNOSIS — M459 Ankylosing spondylitis of unspecified sites in spine: Secondary | ICD-10-CM | POA: Diagnosis not present

## 2018-10-02 MED FILL — HUMIRA PEN 40 MG/0.4ML PNKT: 40 | 28 days supply | Qty: 2 | Fill #1

## 2018-10-11 NOTE — Progress Notes (Signed)
   S: Patient presents today for review of their specialty medication.   Patient is to begin therapy withCimzia (certolizumab) for UC and ankylosing spondylitis. Patient is managed by Dr. Trudie Reed for this.   Dosing: Note: Each 400 mg dose should be administered as 2 injections of 200 mg each. Ankylosing spondylitis/UC: SubQ: Initial: 400 mg, repeat dose 2 and 4 weeks after initial dose; Maintenance: 200 mg every 2 weeks  Adherence: has not yet started therapy   Efficacy: has not yet started therapy   Current adverse effects: has not yet started therapy   O: Lab Results  Component Value Date   WBC 4.9 08/09/2018   HGB 14.4 08/09/2018   HCT 41.9 08/09/2018   MCV 86.3 08/09/2018   PLT 194.0 08/09/2018     Chemistry      Component Value Date/Time   NA 140 07/26/2017 1610   K 3.8 07/26/2017 1610   CL 103 07/26/2017 1610   CO2 27 07/26/2017 1610   BUN 15 07/26/2017 1610   CREATININE 1.12 07/26/2017 1610      Component Value Date/Time   CALCIUM 9.2 07/26/2017 1610   ALKPHOS 91 07/26/2017 1610   AST 17 07/26/2017 1610   ALT 20 07/26/2017 1610   BILITOT 0.4 07/26/2017 1610     A/P: 1. Medication review: patient currently prescribed Cimzia for UC/Ankylosing spondylitis. Reviewed the medication with the patient, including the following: Cimzia is a TNF blocking agent used in the treatment of ankylosing spondylitis, axial spondyloarthritis, Crohn disease, plaque psoriasis, psoriatic arthritis, and rheumatoid arthritis. The medication should be room temp prior to administration an should be administered subcutaneously. Rotate injection sites and do not inject into damaged skin or into moles or scars. Possible adverse effects include GI upset, antibody development, increased risk of infection, hypersensitivity, demyelinating CNS disease, hematologic effects, and increased risk of malignancy. Use with caution in patients with heart failure. Avoid use of live vaccinations and let doctor  know of any infections as treatment with Cimzia may need to be held during illness. No recommendations for any changes.  Benard Halsted, PharmD, [place] [phone]

## 2018-10-12 ENCOUNTER — Other Ambulatory Visit: Payer: Self-pay

## 2018-10-12 ENCOUNTER — Ambulatory Visit: Payer: 59 | Attending: Internal Medicine | Admitting: Pharmacist

## 2018-10-12 DIAGNOSIS — Z79899 Other long term (current) drug therapy: Secondary | ICD-10-CM

## 2018-10-12 MED ORDER — CIMZIA STARTER KIT 6 X 200 MG/ML ~~LOC~~ KIT: PACK | SUBCUTANEOUS | 0 refills | Status: DC

## 2018-10-12 MED ORDER — CIMZIA PREFILLED 2 X 200 MG/ML ~~LOC~~ KIT: PACK | SUBCUTANEOUS | 5 refills | Status: DC

## 2018-11-21 MED FILL — CIMZIA 200 MG/ML STARTER KI: 6 X 200 | 30 days supply | Qty: 3 | Fill #0

## 2019-01-15 ENCOUNTER — Other Ambulatory Visit: Payer: Self-pay | Admitting: Podiatry

## 2019-01-15 ENCOUNTER — Other Ambulatory Visit: Payer: Self-pay

## 2019-01-15 ENCOUNTER — Encounter: Payer: Self-pay | Admitting: Podiatry

## 2019-01-15 ENCOUNTER — Ambulatory Visit (INDEPENDENT_AMBULATORY_CARE_PROVIDER_SITE_OTHER): Payer: 59

## 2019-01-15 ENCOUNTER — Ambulatory Visit: Payer: 59 | Admitting: Podiatry

## 2019-01-15 DIAGNOSIS — M779 Enthesopathy, unspecified: Secondary | ICD-10-CM

## 2019-01-15 DIAGNOSIS — M79672 Pain in left foot: Secondary | ICD-10-CM

## 2019-01-18 NOTE — Progress Notes (Signed)
Subjective:   Patient ID: Alan Jimenez, male   DOB: 33 y.o.   MRN: [id number]   HPI Patient states he has had a lot of problems with his left foot and states that when he tries to run or be active it gets inflamed and does not allow him to be as active as he would like.  Patient states this has been going on for a fairly long period of time and he is interested in orthotics.  Patient does not smoke likes to be active   Review of Systems  All other systems reviewed and are negative.       Objective:  Physical Exam Vitals signs and nursing note reviewed.  Constitutional:      Appearance: He is well-developed.  Pulmonary:     Effort: Pulmonary effort is normal.  Musculoskeletal: Normal range of motion.  Skin:    General: Skin is warm.  Neurological:     Mental Status: He is alert.     Neurovascular status intact muscle strength was found to be adequate patient found to have inflammation pain of the second MPJ of the left foot with fluid buildup around the joint surface and no indications of digital deformity.  Patient has good digital perfusion well oriented x3 with mild depression of the arch     Assessment:  Inflammatory capsulitis second MPJ left with fluid buildup of an acute nature with also chronic nature to this condition related to activity     Plan:  H&P x-ray reviewed condition discussed.  At this time I did do a proximal nerve block I then aspirated the second MPJ getting out a small amount of clear fluid injected quarter cc dexamethasone Kenalog and applied thick plantar padding to the forefoot with instructions on usage along with rigid bottom shoes.  Discussed long-term orthotics but first I want to make sure we can improve the condition patient will be seen back 2 weeks to reevaluate  X-rays were negative for signs of fracture or arthritic conditions with mild increase of the parabola of the second metatarsal left

## 2019-01-29 ENCOUNTER — Other Ambulatory Visit: Payer: Self-pay

## 2019-01-29 ENCOUNTER — Encounter: Payer: Self-pay | Admitting: Podiatry

## 2019-01-29 ENCOUNTER — Ambulatory Visit: Payer: 59 | Admitting: Podiatry

## 2019-01-29 DIAGNOSIS — M7751 Other enthesopathy of right foot: Secondary | ICD-10-CM | POA: Diagnosis not present

## 2019-01-29 DIAGNOSIS — M779 Enthesopathy, unspecified: Secondary | ICD-10-CM

## 2019-01-29 DIAGNOSIS — M7752 Other enthesopathy of left foot: Secondary | ICD-10-CM | POA: Diagnosis not present

## 2019-01-29 NOTE — Progress Notes (Signed)
Subjective:   Patient ID: Alan Jimenez, male   DOB: 33 y.o.   MRN: [id number]   HPI Patient states I am some improved but still have discomfort and if not really tested it yet but it feels like it is in the area that he worked   ROS      Objective:  Physical Exam  Neurovascular status found to be intact with inflammation still noted around the second MPJ left with moderate improvement     Assessment:  Capsulitis second MPJ left improved but present     Plan:  H&P reviewed condition at this point I recommended long-term orthotics to try to disperse the weight off the joint surface.  Patient is casted for functional orthotics to try to reduce the weightbearing pressure against the capsule of the second MPJ left and ultimately may require shortening osteotomy

## 2019-02-01 MED FILL — CIMZIA 200 MG/ML SYRN KIT: 2 X 200 | 30 days supply | Qty: 1 | Fill #0

## 2019-02-07 ENCOUNTER — Other Ambulatory Visit: Payer: Self-pay | Admitting: Pharmacist

## 2019-02-07 DIAGNOSIS — M459 Ankylosing spondylitis of unspecified sites in spine: Secondary | ICD-10-CM | POA: Diagnosis not present

## 2019-02-07 DIAGNOSIS — M940 Chondrocostal junction syndrome [Tietze]: Secondary | ICD-10-CM | POA: Diagnosis not present

## 2019-02-07 DIAGNOSIS — Z6831 Body mass index (BMI) 31.0-31.9, adult: Secondary | ICD-10-CM | POA: Diagnosis not present

## 2019-02-07 DIAGNOSIS — Z79899 Other long term (current) drug therapy: Secondary | ICD-10-CM | POA: Diagnosis not present

## 2019-02-07 DIAGNOSIS — E669 Obesity, unspecified: Secondary | ICD-10-CM | POA: Diagnosis not present

## 2019-02-07 DIAGNOSIS — K5190 Ulcerative colitis, unspecified, without complications: Secondary | ICD-10-CM | POA: Diagnosis not present

## 2019-02-07 DIAGNOSIS — M461 Sacroiliitis, not elsewhere classified: Secondary | ICD-10-CM | POA: Diagnosis not present

## 2019-02-07 DIAGNOSIS — E569 Vitamin deficiency, unspecified: Secondary | ICD-10-CM | POA: Diagnosis not present

## 2019-02-07 MED ORDER — CIMZIA 2 X 200 MG ~~LOC~~ KIT: PACK | SUBCUTANEOUS | 6 refills | Status: DC

## 2019-02-07 MED ORDER — CIMZIA PREFILLED 2 X 200 MG/ML ~~LOC~~ KIT: PACK | SUBCUTANEOUS | 6 refills | Status: AC

## 2019-02-26 ENCOUNTER — Ambulatory Visit: Payer: 59 | Admitting: Orthotics

## 2019-02-26 ENCOUNTER — Other Ambulatory Visit: Payer: Self-pay

## 2019-02-26 DIAGNOSIS — M779 Enthesopathy, unspecified: Secondary | ICD-10-CM

## 2019-03-01 NOTE — Progress Notes (Signed)
Patient came in today to pick up custom made foot orthotics.  The goals were accomplished and the patient reported no dissatisfaction with said orthotics.  Patient was advised of breakin period and how to report any issues. 

## 2019-03-07 ENCOUNTER — Other Ambulatory Visit: Payer: Self-pay | Admitting: Podiatry

## 2019-03-07 DIAGNOSIS — M779 Enthesopathy, unspecified: Secondary | ICD-10-CM

## 2019-03-09 MED FILL — CIMZIA 200 MG/ML SYRN KIT: 2 X 200 | 30 days supply | Qty: 1 | Fill #1

## 2019-03-17 ENCOUNTER — Encounter: Payer: Self-pay | Admitting: Family Medicine

## 2019-03-27 ENCOUNTER — Encounter: Payer: Self-pay | Admitting: Family Medicine

## 2019-03-27 ENCOUNTER — Ambulatory Visit: Payer: 59 | Admitting: Family Medicine

## 2019-03-27 ENCOUNTER — Other Ambulatory Visit: Payer: Self-pay

## 2019-03-27 VITALS — BP 126/78 | HR 91 | Temp 97.6°F | Ht 70.0 in | Wt 228.2 lb

## 2019-03-27 DIAGNOSIS — R002 Palpitations: Secondary | ICD-10-CM

## 2019-03-27 NOTE — Progress Notes (Signed)
   Chief Complaint:  Alan Jimenez is a 33 y.o. male who presents today with a chief complaint of palpitations.   Assessment/Plan:  Palpitations Normal EKG.  Likely PVCs.  Given decreased exercise tolerance, will check echocardiogram.  Continue avoidance of alcohol.  Recommended good oral hydration and stress management..     Subjective:  HPI:  Palpitations  Started a few months ago. Improved but worsened over the last few weeks. Has tried cutting caffeine.  Has noticed abnormalities on an apple watch EKG. No chest pain or shortness of breath. No dizziness. Symptoms are exercise limiting with moderate intensity exercise.   ROS: Per HPI  PMH: He reports that he has never smoked. He has never used smokeless tobacco. He reports that he does not drink alcohol or use drugs.      Objective:  Physical Exam: BP 126/78   Pulse 91   Temp 97.6 F (36.4 C)   Ht 5' 10"  (1.778 m)   Wt 228 lb 4 oz (103.5 kg)   SpO2 98%   BMI 32.75 kg/m   Gen: NAD, resting comfortably CV: Regular rate and rhythm with no murmurs appreciated Pulm: Normal work of breathing, clear to auscultation bilaterally with no crackles, wheezes, or rhonchi GI: Normal bowel sounds present. Soft, Nontender, Nondistended. MSK: No edema, cyanosis, or clubbing noted Skin: Warm, dry Neuro: Grossly normal, moves all extremities Psych: Normal affect and thought content  EKG: NSR. No ischemic changes.       Algis Greenhouse. Jerline Pain, MD 03/27/2019 2:21 PM

## 2019-03-27 NOTE — Patient Instructions (Signed)
It was very nice to see you today!  We will set you up to have an echocardiogram.  You should be called within the next week or to have this scheduled.  Take care, Dr Jerline Pain  Please try these tips to maintain a healthy lifestyle:   Eat at least 3 REAL meals and 1-2 snacks per day.  Aim for no more than 5 hours between eating.  If you eat breakfast, please do so within one hour of getting up.    Each meal should contain half fruits/vegetables, one quarter protein, and one quarter carbs (no bigger than a computer mouse)   Cut down on sweet beverages. This includes juice, soda, and sweet tea.     Drink at least 1 glass of water with each meal and aim for at least 8 glasses per day   Exercise at least 150 minutes every week.

## 2019-04-04 ENCOUNTER — Encounter: Payer: Self-pay | Admitting: Family Medicine

## 2019-04-04 ENCOUNTER — Other Ambulatory Visit: Payer: Self-pay

## 2019-04-04 DIAGNOSIS — M069 Rheumatoid arthritis, unspecified: Secondary | ICD-10-CM

## 2019-04-04 DIAGNOSIS — K5100 Ulcerative (chronic) pancolitis without complications: Secondary | ICD-10-CM

## 2019-04-06 ENCOUNTER — Other Ambulatory Visit: Payer: Self-pay

## 2019-04-06 ENCOUNTER — Ambulatory Visit (HOSPITAL_COMMUNITY): Payer: No Typology Code available for payment source | Attending: Cardiology

## 2019-04-06 DIAGNOSIS — R002 Palpitations: Secondary | ICD-10-CM | POA: Insufficient documentation

## 2019-04-09 MED FILL — CIMZIA 200 MG/ML SYRN KIT: 2 X 200 | 30 days supply | Qty: 1 | Fill #0

## 2019-04-09 NOTE — Progress Notes (Signed)
Please inform patient of the following:  His aorta is mildly dilated but everything else is normal. We do not have to, but may not be a bad idea to check again in a year. Recommend he schedule office visit to discuss further if he wishes.

## 2019-04-11 ENCOUNTER — Other Ambulatory Visit: Payer: Self-pay

## 2019-04-11 DIAGNOSIS — M069 Rheumatoid arthritis, unspecified: Secondary | ICD-10-CM

## 2019-04-11 NOTE — Progress Notes (Deleted)
This encounter was created in error - please disregard.

## 2019-05-16 MED FILL — CIMZIA 200 MG/ML SYRN KIT: 2 X 200 | 28 days supply | Qty: 1 | Fill #0

## 2019-06-11 MED FILL — CIMZIA 200 MG/ML SYRN KIT: 2 X 200 | 28 days supply | Qty: 1 | Fill #1

## 2019-07-03 ENCOUNTER — Encounter: Payer: Self-pay | Admitting: Podiatry

## 2019-07-09 ENCOUNTER — Encounter: Payer: Self-pay | Admitting: Family Medicine

## 2019-07-09 ENCOUNTER — Other Ambulatory Visit: Payer: Self-pay | Admitting: *Deleted

## 2019-07-09 DIAGNOSIS — E669 Obesity, unspecified: Secondary | ICD-10-CM

## 2019-07-09 MED FILL — CIMZIA 200 MG/ML SYRN KIT: 2 X 200 | 28 days supply | Qty: 1 | Fill #2

## 2019-07-09 NOTE — Telephone Encounter (Signed)
Referral placed.

## 2019-07-09 NOTE — Telephone Encounter (Signed)
Please advise 

## 2019-07-13 ENCOUNTER — Encounter: Payer: Self-pay | Admitting: Family Medicine

## 2019-07-15 ENCOUNTER — Encounter: Payer: Self-pay | Admitting: Family Medicine

## 2019-07-16 NOTE — Telephone Encounter (Signed)
LVM for patient to call back and schedule

## 2019-07-23 ENCOUNTER — Ambulatory Visit: Payer: 59 | Admitting: Podiatry

## 2019-07-23 ENCOUNTER — Other Ambulatory Visit: Payer: Self-pay

## 2019-07-23 ENCOUNTER — Encounter: Payer: Self-pay | Admitting: Podiatry

## 2019-07-23 ENCOUNTER — Ambulatory Visit (INDEPENDENT_AMBULATORY_CARE_PROVIDER_SITE_OTHER): Payer: No Typology Code available for payment source

## 2019-07-23 DIAGNOSIS — M216X9 Other acquired deformities of unspecified foot: Secondary | ICD-10-CM

## 2019-07-23 DIAGNOSIS — M779 Enthesopathy, unspecified: Secondary | ICD-10-CM

## 2019-07-23 NOTE — Patient Instructions (Signed)
Pre-Operative Instructions  Congratulations, you have decided to take an important step towards improving your quality of life.  You can be assured that the doctors and staff at [facility] will be with you every step of the way.  Here are some important things you should know:  1. Plan to be at the surgery center/hospital at least 1 (one) hour prior to your scheduled time, unless otherwise directed by the surgical center/hospital staff.  You must have a responsible adult accompany you, remain during the surgery and drive you home.  Make sure you have directions to the surgical center/hospital to ensure you arrive on time. 2. If you are having surgery at [facility] or [facility] hospitals, you will need a copy of your medical history and physical form from your family physician within one month prior to the date of surgery. We will give you a form for your primary physician to complete.  3. We make every effort to accommodate the date you request for surgery.  However, there are times where surgery dates or times have to be moved.  We will contact you as soon as possible if a change in schedule is required.   4. No aspirin/ibuprofen for one week before surgery.  If you are on aspirin, any non-steroidal anti-inflammatory medications (Mobic, Aleve, Ibuprofen) should not be taken seven (7) days prior to your surgery.  You make take Tylenol for pain prior to surgery.  5. Medications - If you are taking daily heart and blood pressure medications, seizure, reflux, allergy, asthma, anxiety, pain or diabetes medications, make sure you notify the surgery center/hospital before the day of surgery so they can tell you which medications you should take or avoid the day of surgery. 6. No food or drink after midnight the night before surgery unless directed otherwise by surgical center/hospital staff. 7. No alcoholic beverages 24-hours prior to surgery.  No smoking 24-hours prior or 24-hours after  surgery. 8. Wear loose pants or shorts. They should be loose enough to fit over bandages, boots, and casts. 9. Don't wear slip-on shoes. Sneakers are preferred. 10. Bring your boot with you to the surgery center/hospital.  Also bring crutches or a walker if your physician has prescribed it for you.  If you do not have this equipment, it will be provided for you after surgery. 11. If you have not been contacted by the surgery center/hospital by the day before your surgery, call to confirm the date and time of your surgery. 12. Leave-time from work may vary depending on the type of surgery you have.  Appropriate arrangements should be made prior to surgery with your employer. 13. Prescriptions will be provided immediately following surgery by your doctor.  Fill these as soon as possible after surgery and take the medication as directed. Pain medications will not be refilled on weekends and must be approved by the doctor. 14. Remove nail polish on the operative foot and avoid getting pedicures prior to surgery. 15. Wash the night before surgery.  The night before surgery wash the foot and leg well with water and the antibacterial soap provided. Be sure to pay special attention to beneath the toenails and in between the toes.  Wash for at least three (3) minutes. Rinse thoroughly with water and dry well with a towel.  Perform this wash unless told not to do so by your physician.  Enclosed: 1 Ice pack (please put in freezer the night before surgery)   1 Hibiclens skin cleaner     Pre-op instructions  If you have any questions regarding the instructions, please do not hesitate to call our office.  [place]: [address], [place], [place] [address] -- [phone]  [place]: [address] [place], [place] [address] -- [phone]  [place]: [address], [place], [place] [address] -- [phone]   Website: [email or web address] 

## 2019-07-27 NOTE — Progress Notes (Signed)
Subjective:   Patient ID: Alan Jimenez, male   DOB: 34 y.o.   MRN: [id number]   HPI Patient presents stating area on his left foot has remained persistently sore and its not having him to run for hike or do activities.  States orthotics have been beneficial but he continues to have problems with   ROS      Objective:  Physical Exam  Neurovascular status intact with patient's left second MPJ continue to exhibit discomfort in both the MPJ and slightly distal.  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  I do think at this point given young age and length of time he has had this over a year I recommended shortening osteotomy.  I explained procedure risk to the patient patient wants to have this done and at this point I went ahead and allowed him to read consent form going over alternative treatments complications associated with procedure.  Patient is willing to accept surgery and understands complication and after extensive review signed consent form.  Patient understands total recovery can take approximately 6 months and there is no long-term guarantees this will solve his problem and at this time I also dispensed air fracture walker with all instructions on usage.  X-rays indicate that there is no signs of an arthritic process stress fracture or other advanced bony pathology around the second MPJ left

## 2019-07-30 ENCOUNTER — Telehealth: Payer: Self-pay

## 2019-07-30 NOTE — Telephone Encounter (Signed)
DOS 08/07/19  METATARSAL OSTEOTOMY 2ND LT - 28308  [facility] EFFECTIVE DATE - 03/30/2019  PLAN DEDUCTIBLE - $0.00 OUT OF POCKET - $2500 W/$2500 MET COPAY $500 COINSURANCE - 60%  SPOKE TO CLAUDIA AT [facility] CALL REF # [id number].  RECEIVED FAX FROM [facility]  CPT 28308 APPROVED/AUTH # [id number] AUTH WINDOW - 08/07/19 - 09/06/19 

## 2019-07-31 MED FILL — CIMZIA 200 MG/ML SYRN KIT: 2 X 200 | 28 days supply | Qty: 1 | Fill #3

## 2019-08-07 ENCOUNTER — Encounter: Payer: Self-pay | Admitting: Podiatry

## 2019-08-07 DIAGNOSIS — M21542 Acquired clubfoot, left foot: Secondary | ICD-10-CM | POA: Diagnosis not present

## 2019-08-07 MED ORDER — HYDROCODONE-ACETAMINOPHEN 10-325 MG PO TABS: 1.0000 | ORAL_TABLET | Freq: Three times a day (TID) | ORAL | 0 refills | Status: AC | PRN

## 2019-08-07 NOTE — Addendum Note (Signed)
Addended by: Wallene Huh on: 08/07/2019 06:49 AM   Modules accepted: Orders

## 2019-08-15 ENCOUNTER — Other Ambulatory Visit: Payer: Self-pay

## 2019-08-15 ENCOUNTER — Ambulatory Visit (INDEPENDENT_AMBULATORY_CARE_PROVIDER_SITE_OTHER): Payer: No Typology Code available for payment source | Admitting: Podiatry

## 2019-08-15 ENCOUNTER — Ambulatory Visit (INDEPENDENT_AMBULATORY_CARE_PROVIDER_SITE_OTHER): Payer: No Typology Code available for payment source

## 2019-08-15 ENCOUNTER — Encounter: Payer: Self-pay | Admitting: Podiatry

## 2019-08-15 ENCOUNTER — Encounter: Payer: Self-pay | Admitting: Family Medicine

## 2019-08-15 VITALS — BP 135/88 | HR 82 | Temp 97.7°F | Resp 16

## 2019-08-15 DIAGNOSIS — M779 Enthesopathy, unspecified: Secondary | ICD-10-CM

## 2019-08-15 NOTE — Progress Notes (Signed)
Subjective:   Patient ID: Alan Jimenez, male   DOB: 34 y.o.   MRN: [id number]   HPI Patient states doing really well with surgery and very pleased with minimal discomfort   ROS      Objective:  Physical Exam  Neurovascular status intact with negative Bevelyn Buckles' sign noted with patient's left foot healing well with wound edges well coapted and reduce discomfort plantarly     Assessment:  Doing well post osteotomy second metatarsal left     Plan:  H&P x-ray reviewed and recommended the continuation of immobilization compression elevation and reappoint in 3 to 4 weeks or earlier if needed.  Patient is encouraged to call with questions  X-rays indicate osteotomies healing well parabola looks good screw in place

## 2019-08-16 ENCOUNTER — Telehealth: Payer: Self-pay | Admitting: *Deleted

## 2019-08-16 ENCOUNTER — Encounter: Payer: Self-pay | Admitting: Podiatry

## 2019-08-16 NOTE — Telephone Encounter (Signed)
Left message informing pt he may be having swelling that is change the positions of soft tissue and if he had a fall and was in the boot it would have to be very serious to cause a change of position in the surgery site. I instructed pt to rest, ice 15 minutes/hour like he did after surgery for the next 2 days and remain in the boot, I would inform Dr. Paulla Dolly and call with more instructions.

## 2019-08-16 NOTE — Telephone Encounter (Signed)
Pt states he was doing fine after post op check yesterday then later in the day began to have a popping sensation.

## 2019-08-20 NOTE — Telephone Encounter (Signed)
I informed pt of Dr. Mellody Drown statement and pt states understanding and is doing fine.

## 2019-08-20 NOTE — Telephone Encounter (Signed)
Should go away. Probably fluid with removal of initial dressing

## 2019-08-23 ENCOUNTER — Telehealth: Payer: Self-pay | Admitting: Podiatry

## 2019-08-23 NOTE — Telephone Encounter (Signed)
Called patient to get him for an appointment with Dr.Regal. LVM for him to contact the [facility] to schedule

## 2019-08-28 MED FILL — CIMZIA 200 MG/ML SYRN KIT: 2 X 200 | 28 days supply | Qty: 1 | Fill #4

## 2019-09-12 ENCOUNTER — Ambulatory Visit (INDEPENDENT_AMBULATORY_CARE_PROVIDER_SITE_OTHER): Payer: No Typology Code available for payment source

## 2019-09-12 ENCOUNTER — Other Ambulatory Visit: Payer: Self-pay

## 2019-09-12 ENCOUNTER — Ambulatory Visit (INDEPENDENT_AMBULATORY_CARE_PROVIDER_SITE_OTHER): Payer: No Typology Code available for payment source | Admitting: Podiatry

## 2019-09-12 ENCOUNTER — Encounter: Payer: Self-pay | Admitting: Podiatry

## 2019-09-12 VITALS — Temp 97.7°F

## 2019-09-12 DIAGNOSIS — M779 Enthesopathy, unspecified: Secondary | ICD-10-CM

## 2019-09-12 NOTE — Progress Notes (Signed)
Subjective:   Patient ID: Alan Jimenez, male   DOB: 34 y.o.   MRN: [id number]   HPI Patient states he is feeling well with some stiffness in the joint but he is making progress and wearing tennis shoes   ROS      Objective:  Physical Exam  No neurovascular status intact negative Bevelyn Buckles' sign noted wound edges well coapted good structural alignment noted with mild restriction of motion of the second MPJ     Assessment:  Doing well post osteotomy sec metatarsal left with mild stiffness     Plan:  H&P reviewed final x-ray advised this patient on return to normal activity care and the importance of exercising the joint.  Explained will probably take 6 to 9 months to get better completely.  X-rays indicate that there is excellent healing of the osteotomy second metatarsal left no indications of motion with good alignment noted good positional component

## 2019-09-20 MED FILL — CIMZIA 200 MG/ML SYRN KIT: 2 X 200 | 28 days supply | Qty: 1 | Fill #5

## 2019-10-22 ENCOUNTER — Encounter: Payer: Self-pay | Admitting: Podiatry

## 2019-10-23 MED FILL — CIMZIA 200 MG/ML SYRN KIT: 2 X 200 | 28 days supply | Qty: 1 | Fill #6

## 2020-07-21 IMAGING — US ULTRASOUND RIGHT BREAST COMPLETE
1 series · 11 of 11 positions shown · non-contrast
Comparison: None.

ACR Breast Density Category a: The breast tissue is almost entirely
fatty.

CLINICAL DATA: 33-year-old male patient describing multiple
palpable nodules within each breast. Clinical data also describes
lymphadenopathy.

EXAM:
DIGITAL DIAGNOSTIC BILATERAL MAMMOGRAM WITH CAD AND TOMO
ULTRASOUND BILATERAL BREAST

[Series 1: ultrasound right breast complete · 0.06mm/px · 11 of 11 slices shown]
[im 1/11]
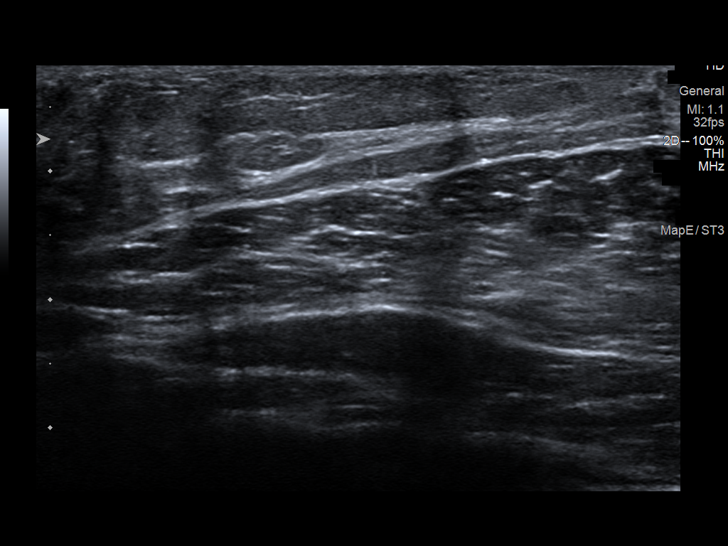
[im 2/11]
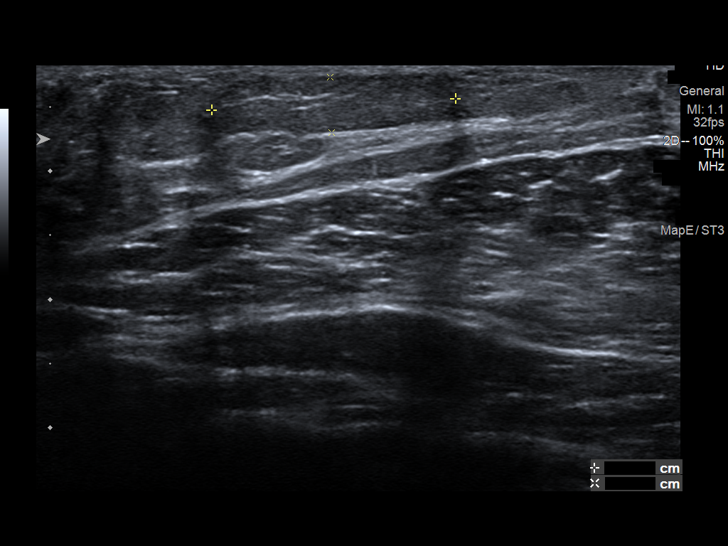
[im 3/11]
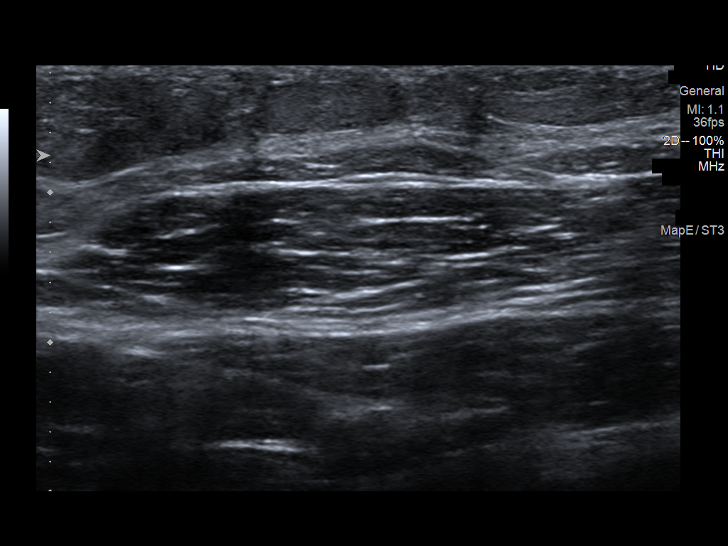
[im 4/11]
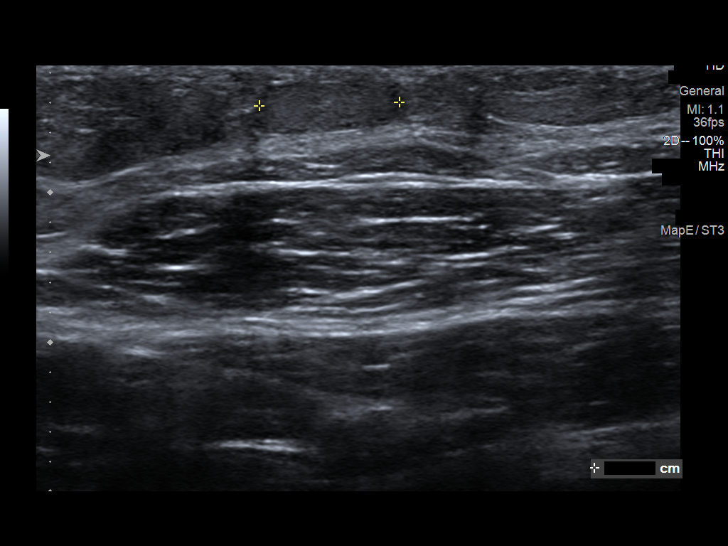
[im 5/11]
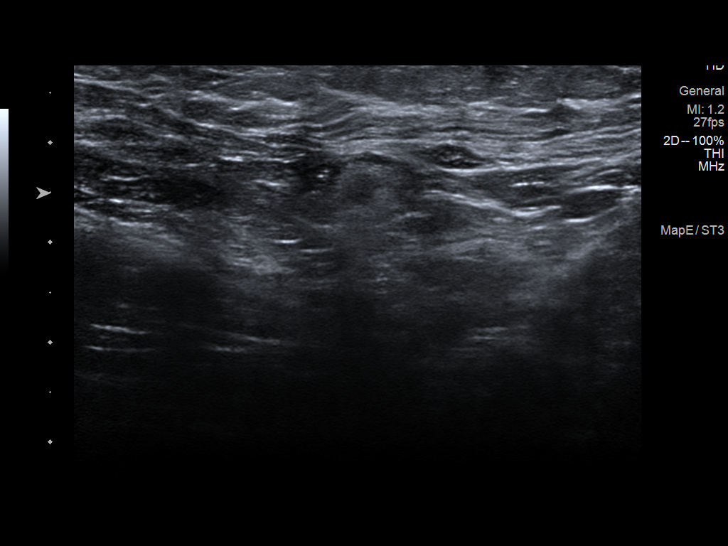
[im 6/11]
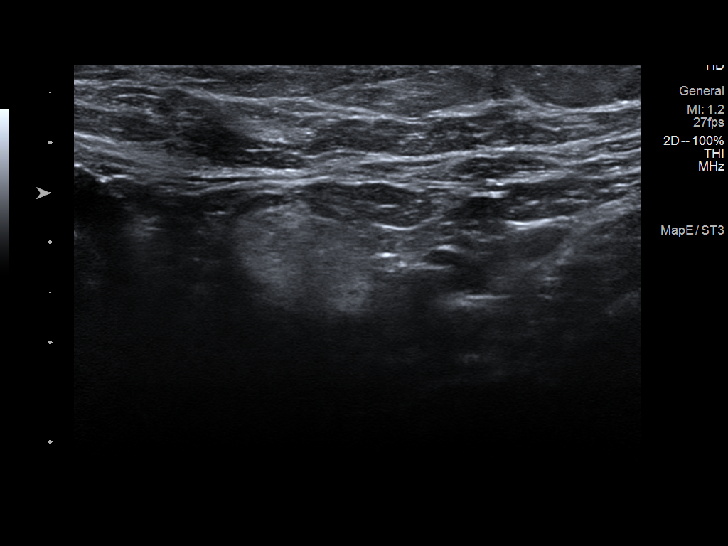
[im 7/11]
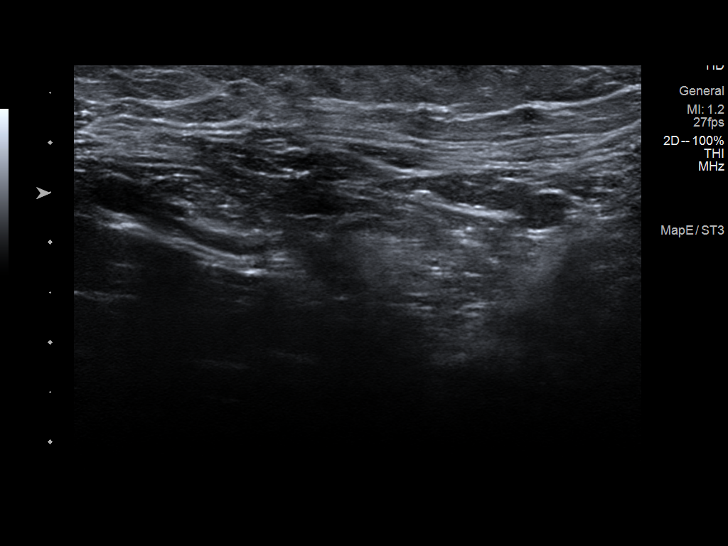
[im 8/11]
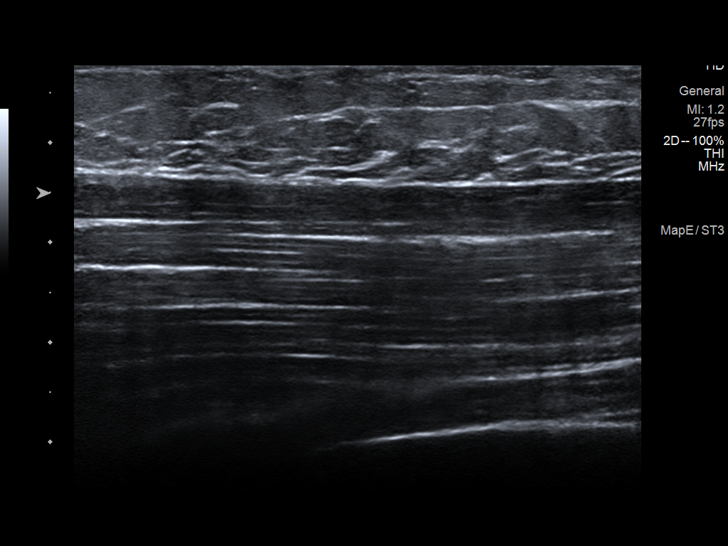
[im 9/11]
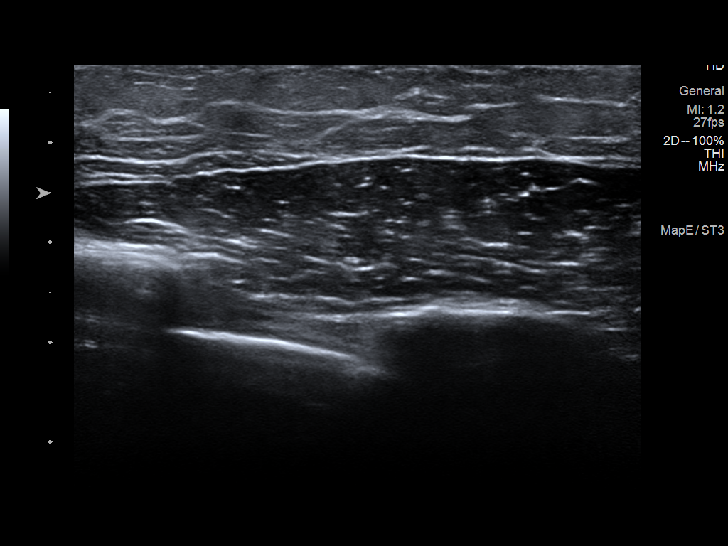
[im 10/11]
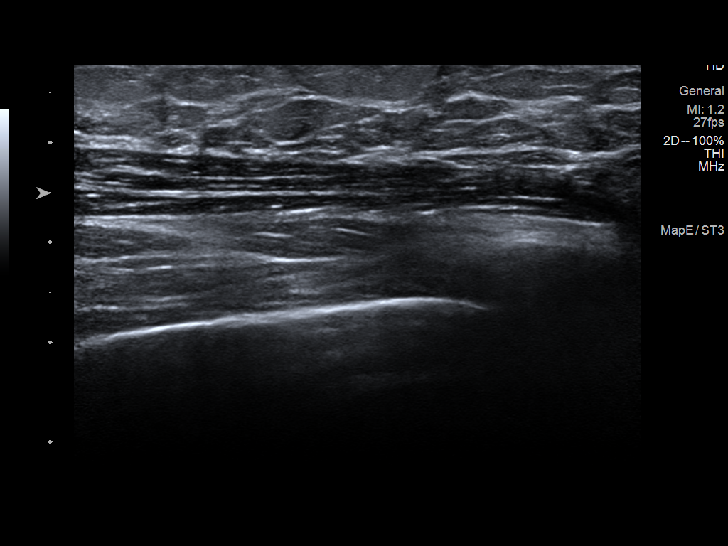
[im 11/11]
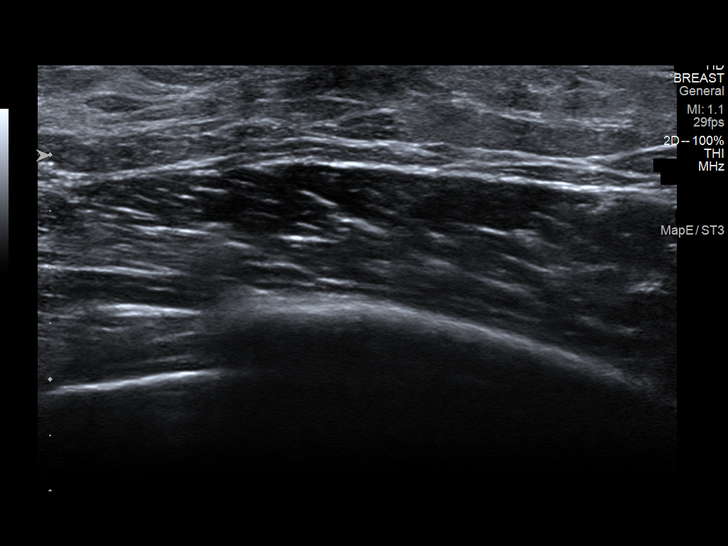

[11 of 11 positions shown; findings below may reference images not displayed]

FINDINGS: There are no dominant masses, suspicious calcifications or secondary
signs of malignancy within either breast. No enlarged lymph nodes
are appreciated within the visualized portions of the lower axillae.

Mammographic images were processed with CAD.

RIGHT breast: Ultrasound was performed of all 4 quadrants of the
RIGHT breast and the retroareolar RIGHT breast, showing no
suspicious solid or cystic mass. The outer RIGHT breast was further
evaluated with ultrasound corresponding to the most prominent
palpable lump as directed by the patient, showing a benign lipoma at
the 10 o'clock axis which measures 1.9 x 0.4 x 0.9 cm.

LEFT breast: Next, ultrasound was performed of all 4 quadrants of
the LEFT breast and the retroareolar LEFT breast, showing no
suspicious solid or cystic mass. No additional lipomas were
confirmed.

RIGHT axilla: No enlarged or morphologically abnormal lymph nodes
identified.

LEFT axilla: No enlarged or morphologically abnormal lymph nodes
identified.
IMPRESSION: 1. No evidence of malignancy within either breast.
2. No enlarged or morphologically abnormal lymph nodes within either
axillary region.
3. Benign lipoma within the RIGHT breast at the 10 o'clock axis,
measuring 1.9 cm.

RECOMMENDATION:
1. Clinical follow-up.
2. The patient was instructed to return if any area that he feels
becomes larger and/or firmer to palpation, or if a new palpable
abnormality is identified in either breast.

I have discussed the findings and recommendations with the patient.
Results were also provided in writing at the conclusion of the
visit. If applicable, a reminder letter will be sent to the patient
regarding the next appointment.

BI-RADS CATEGORY  2: Benign.

## 2020-07-21 IMAGING — US ULTRASOUND LEFT BREAST COMPLETE
1 series · 10 of 10 positions shown · non-contrast
Comparison: None.

ACR Breast Density Category a: The breast tissue is almost entirely
fatty.

CLINICAL DATA: 33-year-old male patient describing multiple
palpable nodules within each breast. Clinical data also describes
lymphadenopathy.

EXAM:
DIGITAL DIAGNOSTIC BILATERAL MAMMOGRAM WITH CAD AND TOMO
ULTRASOUND BILATERAL BREAST

[Series 1: ultrasound left breast complete · 0.06mm/px · 10 of 10 slices shown]
[im 1/10]
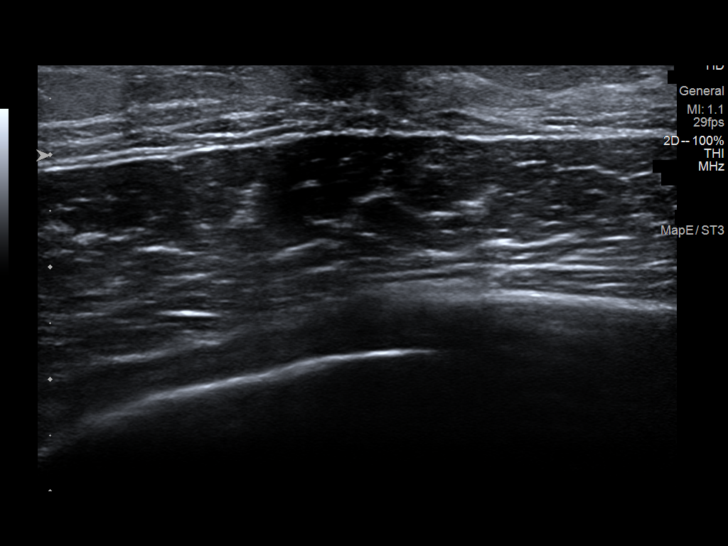
[im 2/10]
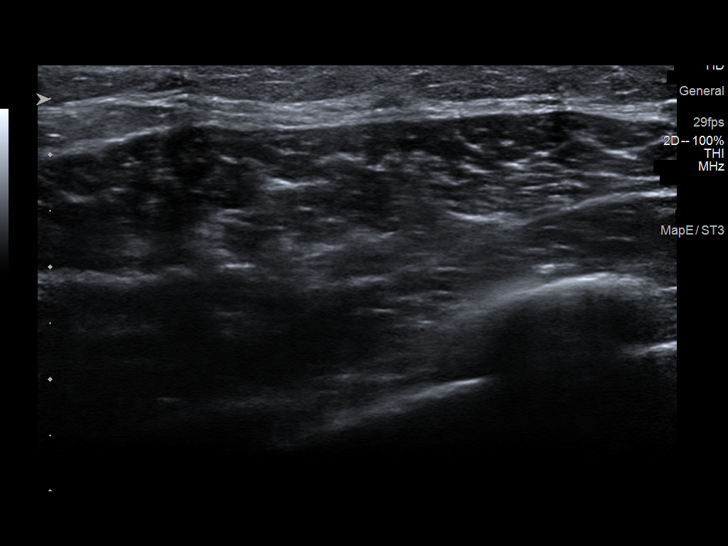
[im 3/10]
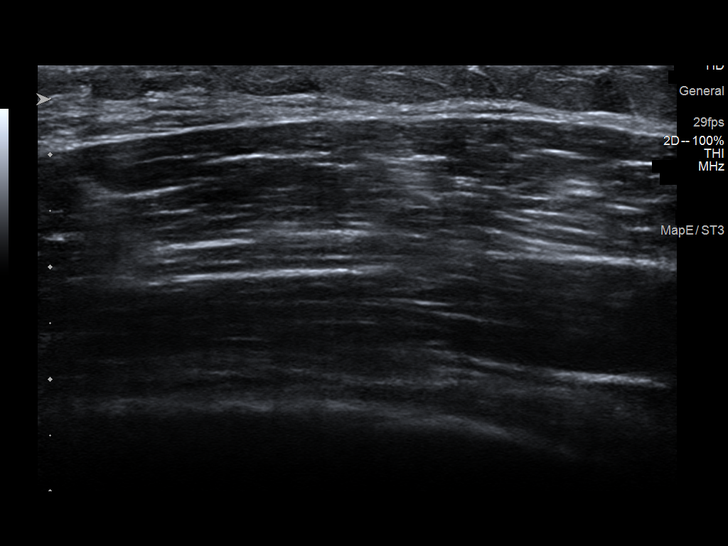
[im 4/10]
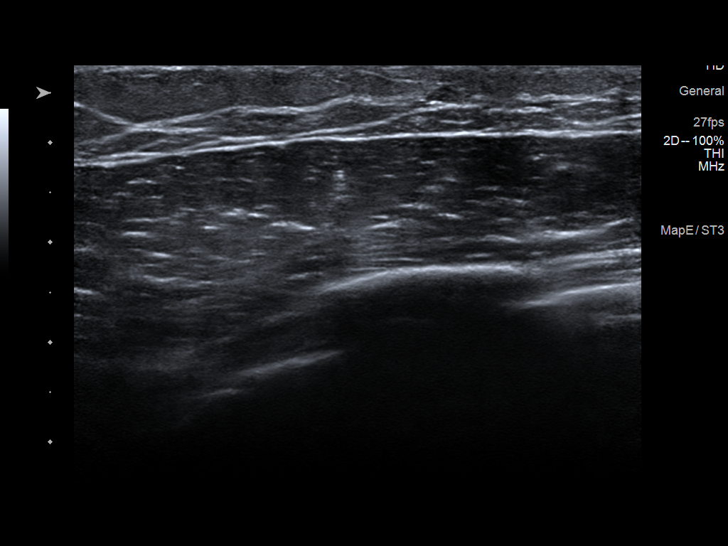
[im 5/10]
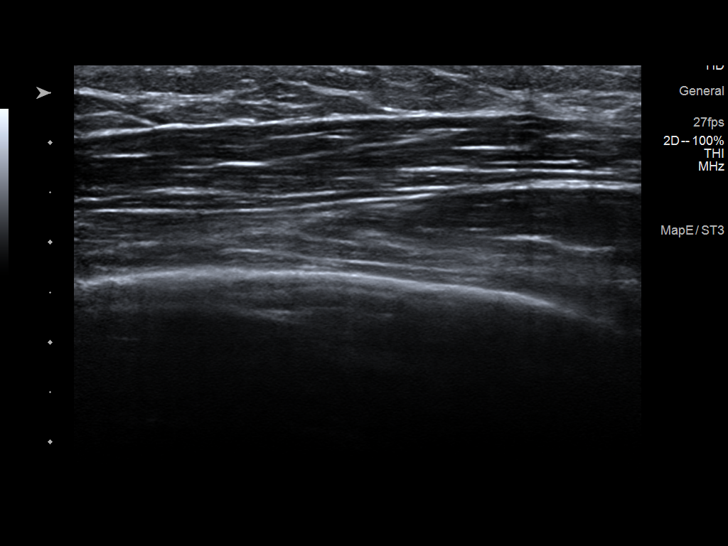
[im 6/10]
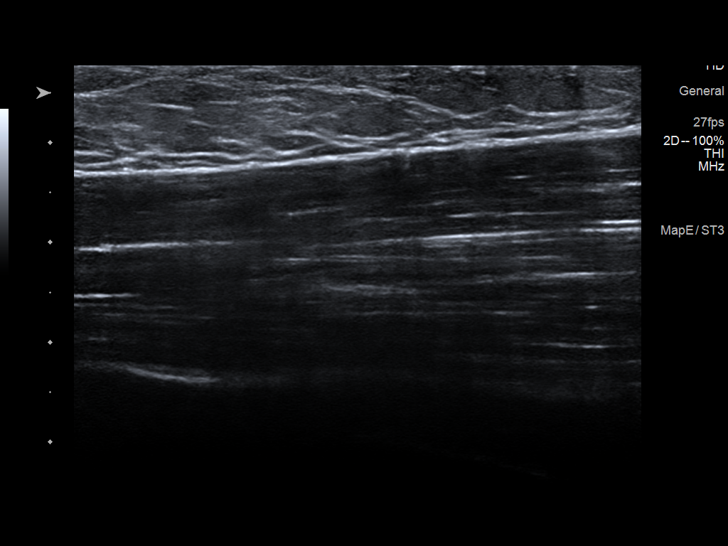
[im 7/10]
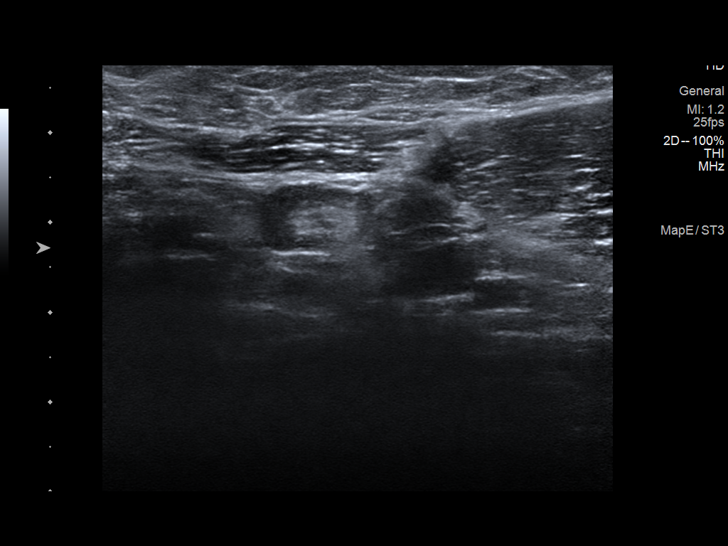
[im 8/10]
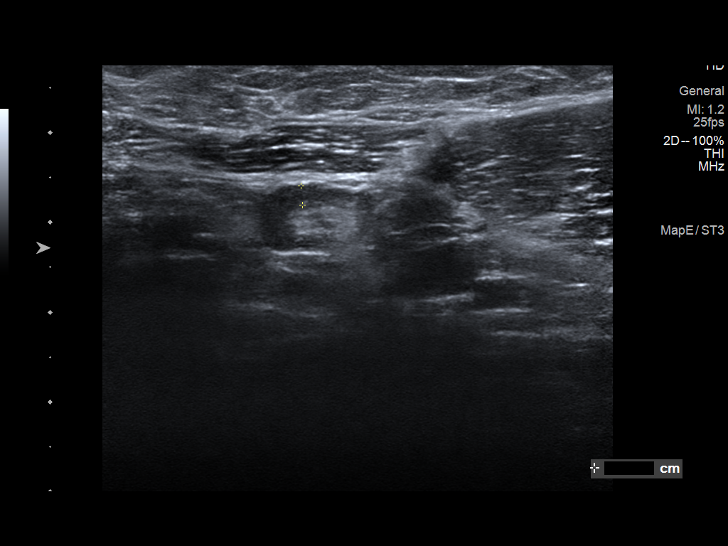
[im 9/10]
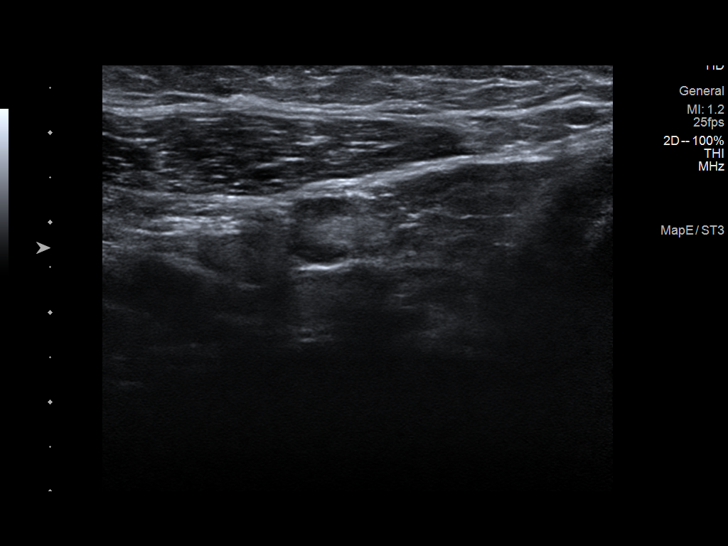
[im 10/10]
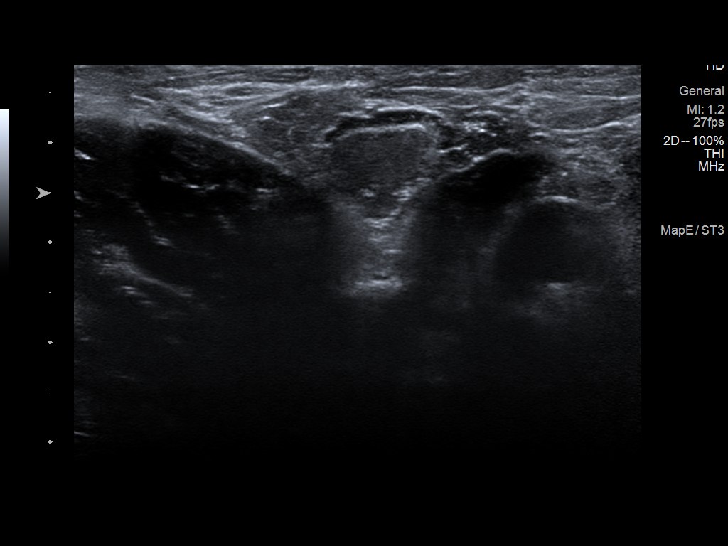

[10 of 10 positions shown; findings below may reference images not displayed]

FINDINGS: There are no dominant masses, suspicious calcifications or secondary
signs of malignancy within either breast. No enlarged lymph nodes
are appreciated within the visualized portions of the lower axillae.

Mammographic images were processed with CAD.

RIGHT breast: Ultrasound was performed of all 4 quadrants of the
RIGHT breast and the retroareolar RIGHT breast, showing no
suspicious solid or cystic mass. The outer RIGHT breast was further
evaluated with ultrasound corresponding to the most prominent
palpable lump as directed by the patient, showing a benign lipoma at
the 10 o'clock axis which measures 1.9 x 0.4 x 0.9 cm.

LEFT breast: Next, ultrasound was performed of all 4 quadrants of
the LEFT breast and the retroareolar LEFT breast, showing no
suspicious solid or cystic mass. No additional lipomas were
confirmed.

RIGHT axilla: No enlarged or morphologically abnormal lymph nodes
identified.

LEFT axilla: No enlarged or morphologically abnormal lymph nodes
identified.
IMPRESSION: 1. No evidence of malignancy within either breast.
2. No enlarged or morphologically abnormal lymph nodes within either
axillary region.
3. Benign lipoma within the RIGHT breast at the 10 o'clock axis,
measuring 1.9 cm.

RECOMMENDATION:
1. Clinical follow-up.
2. The patient was instructed to return if any area that he feels
becomes larger and/or firmer to palpation, or if a new palpable
abnormality is identified in either breast.

I have discussed the findings and recommendations with the patient.
Results were also provided in writing at the conclusion of the
visit. If applicable, a reminder letter will be sent to the patient
regarding the next appointment.

BI-RADS CATEGORY  2: Benign.

## 2021-08-19 ENCOUNTER — Other Ambulatory Visit (HOSPITAL_COMMUNITY): Payer: Self-pay

## 2021-08-26 ENCOUNTER — Emergency Department: Admit: 2021-08-27 | Payer: PRIVATE HEALTH INSURANCE | Primary: Family Medicine

## 2021-08-26 DIAGNOSIS — S61213A Laceration without foreign body of left middle finger without damage to nail, initial encounter: Secondary | ICD-10-CM

## 2021-08-26 DIAGNOSIS — S61209A Unspecified open wound of unspecified finger without damage to nail, initial encounter: Secondary | ICD-10-CM

## 2021-08-26 NOTE — ED Triage Notes (Signed)
Pt arrived via Triage ambulatory c/o cutting his left hand 3rd digit on a router. Chunk of the tip of his finger is missing. Bleeding under control.

## 2021-08-26 NOTE — ED Provider Notes (Signed)
[facility] Care  Emergency Department Treatment Report  Patient: Justin Haney Age: 36 y.o. Sex: male    Date of Birth: 1985/09/04 Admit Date: 08/26/2021 PCP: Pleas Koch, MD   MRN: [id number]  CSN: [id number]  Attending: Dr. Despina Hick, MD    Room: [phone] Time Dictated: 3:49 AM APP: Blima Rich, MPA, PA-C     (Note to patient:The purpose of this note is to communicate optimally with the other providers involved in your care. It is written using standard medical terminology. The Dragon dictation tool was used in preparation of this note, so please excuse typos/grammatical errors.)    Chief Complaint  Chief Complaint   Patient presents with    Finger Laceration         History of Present Illness   36 y.o. male who presents to the ED with left middle finger laceration.  Patient is right-handed.  Patient states that this happened just prior to arrival.  He states that he was using a router and took the chunk of the tip of his left third digit off.  He states he washed it for 5 minutes prior to coming.  He states that the pain "really is not too bad" he states that it sometimes occasionally throbs and will be 5 out of 10 in pain.  It is nonradiating.    RECORDS REVIEWED: I reviewed the patient's previous records here at [facility] and available outside facilities and note that patient has a history of immunocompromise, he had his Tdap updated in 2021    EXTERNAL RESULTS REVIEWED: Labs, imaging, note    INDEPENDENT HISTORIAN: History and/or plan development assisted by: n/a    Review of Systems   Review of Systems   Constitutional: Negative.    HENT: Negative.     Eyes: Negative.    Respiratory: Negative.     Cardiovascular: Negative.    Gastrointestinal: Negative.    Endocrine: Negative.    Genitourinary: Negative.    Musculoskeletal: Negative.    Skin:  Positive for wound.   Allergic/Immunologic: Negative.    Neurological: Negative.    Hematological: Negative.    Psychiatric/Behavioral:  Negative.     All other systems reviewed and are negative.      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 He has never used smokeless tobacco. He reports that he does not drink alcohol and does not use drugs.     Current Medications     Prior to Admission medications    Medication Sig Start Date End Date Taking? Authorizing Provider   CIMZIA 2 X 200 MG/ML PSKT injection Used 2 injections under the skin every 4 weeks 08/17/21  Yes Historical Provider, MD   acetaminophen (  TYLENOL) 500 MG tablet Take 2 tablets by mouth 4 times daily as needed for Pain 08/27/21  Yes Blima Rich, PA-C   ibuprofen (ADVIL;MOTRIN) 600 MG tablet Take 1 tablet by mouth every 6 hours as needed for Pain 08/27/21  Yes Blima Rich, PA-C   oxyCODONE (ROXICODONE) 5 MG immediate release tablet Take 1 tablet by mouth every 6 hours as needed for Pain for up to 3 days. Intended supply: 3 days. Take lowest dose possible to manage pain Max Daily Amount: 20 mg 08/27/21 08/30/21 Yes Blima Rich, PA-C        Allergies     Allergies   Allergen Reactions    Adalimumab      Other reaction(s): other/intolerance    Other Other (See Comments)     Pt is allergic to [facility]       Physical Exam     ED TRIAGE VITALS    ED Triage Vitals [08/26/21 2126]   BP Temp Temp Source Pulse Respirations SpO2 Height Weight - Scale   (!) 145/99 98 F (36.7 C) Oral (!) 101 20 100 % 1.778 m 106.6 kg        Physical  Exam  Vitals and nursing note reviewed.   HENT:      Head: Normocephalic and atraumatic.      Right Ear: External ear normal.      Left Ear: External ear normal.      Nose: Nose normal.      Mouth/Throat:      Mouth: Mucous membranes are moist.   Eyes:      Extraocular Movements: Extraocular movements intact.      Conjunctiva/sclera: Conjunctivae normal.   Cardiovascular:      Rate and Rhythm: Normal rate and regular rhythm.   Pulmonary:      Effort: Pulmonary effort is normal. No respiratory distress.   Abdominal:      General: There is no distension.      Palpations: Abdomen is soft.      Tenderness: There is no abdominal tenderness.   Musculoskeletal:         General: No signs of injury.      Left hand: Laceration present.        Hands:       Cervical back: Normal range of motion.      Comments: Patient has avulsed the tip of his left middle finger, does not involve the nail.  He has intact sensation with good capillary refill   Skin:     General: Skin is warm and dry.      Capillary Refill: Capillary refill takes less than 2 seconds.   Neurological:      General: No focal deficit present.      Mental Status: He is alert.   Psychiatric:         Behavior: Behavior normal.        Impression and Management Plan   36 y.o. male presents with  laceration:    -  Numb, clean and flush    -  tdap update if needed    -  XR hand to r/o frx    DDx includes but is not limited to: laceration, foreign body, arterial injury, nerve injury, fracture or tendon injury.       Further testing and management based on pts response and evolution of sxms.     Diagnostic Studies   Lab:   No results found for this or any previous  visit (from the past 24 hour(s)).     Imaging:  (All imaging personally reviewed and read by myself in conjunction with Dr. Despina Hick, MD)  My interpretation is soft tissue defect but I do not appreciate any obvious fracture per my read.  Final overread will be done by the attending radiologist.    XR FINGER  LEFT (MIN 2 VIEWS)    Result Date: 08/26/2021  EXAMINATION: XR FINGER LEFT (MIN 2 VIEWS) INDICATION: laceration COMPARISON: No comparison available. TECHNIQUE: 2 views left 3rd finger WORKSTATION ID: [id number]     FINDINGS/IMPRESSION: Soft tissue defect extending up to the distal tuft of the 3rd finger. No acute osseous abnormality. Electronically signed by: Albin Felling, MD 08/26/2021 10:58 PM EDT         ED Course     Vitals:    08/26/21 2126   BP: (!) 145/99   Pulse: (!) 101   Resp: 20   Temp: 98 F (36.7 C)   TempSrc: Oral   SpO2: 100%   Weight: 106.6 kg   Height: 1.778 m            Medications   cephALEXin (KEFLEX) capsule 500 mg (500 mg Oral Given 08/27/21 0022)       Lac Repair    Date/Time: 08/26/2021 10:00 PM  Performed by: Blima Rich, PA-C  Authorized by: Despina Hick, MD     Consent:     Consent obtained:  Verbal    Consent given by:  Patient    Risks, benefits, and alternatives were discussed: yes      Risks discussed:  Infection, pain, poor cosmetic result, poor wound healing and need for additional repair  Universal protocol:     Procedure explained and questions answered to patient or proxy's satisfaction: yes      Relevant documents present and verified: yes      Test results available: yes      Imaging studies available: yes      Required blood products, implants, devices, and special equipment available: yes      Site/side marked: yes      Immediately prior to procedure, a time out was called: yes      Patient identity confirmed:  Provided demographic data  Anesthesia:     Anesthesia method:  Nerve block    Block location:  Left hand, 3rd digit    Block needle gauge:  25 G    Block anesthetic:  Lidocaine 2% w/o epi and bupivacaine 0.25% w/o epi    Block technique:  Digital    Block injection procedure:  Anatomic landmarks identified, anatomic landmarks palpated, introduced needle, negative aspiration for blood and incremental injection    Block outcome:  Anesthesia achieved  Laceration  details:     Location:  Finger    Finger location:  L long finger    Length (cm):  3    Depth (mm):  7  Pre-procedure details:     Preparation:  Imaging obtained to evaluate for foreign bodies  Exploration:     Hemostasis achieved with:  Direct pressure    Imaging outcome: foreign body not noted      Wound exploration: wound explored through full range of motion and entire depth of wound visualized      Wound extent: muscle damage      Wound extent: no underlying fracture noted      Contaminated: yes    Treatment:     Area cleansed with:  Chlorhexidine    Amount of cleaning:  Extensive    Irrigation solution:  Sterile saline    Irrigation method:  Syringe    Visualized foreign bodies/material removed: no      Debridement:  None  Skin repair:     Repair method: The wound was left open.  Patient had Xeroform applied and bulky dressing over the top of it.  Post-procedure details:     Dressing:  Bulky dressing    Procedure completion:  Tolerated well, no immediate complications        Threat to body function without evaluation and management: Ortho, skin    SOCIAL DETERMINANTS impacting Evaluation and Management:     -  Access to provider given after hours of operation of offices.      -  Patient's care has been impacted by health system's issues including ED overcrowding, boarding, and functioning over capacity.    Comorbidities impacting Evaluation and Management: History of immunocompromise    Critical Care Time (if necessary) any critical care time if necessary will be documented by my attending physician    Medical Decision Making   NARRATIVE:  Patient presented to the emergency room with an avulsion of the tip of his left middle finger.  X-ray was done which showed no evidence for foreign body, no evidence for underlying fracture or dislocation.  The patient was blocked with a digital block with successful reduction in pain.  After this, the patient underwent extensive cleaning and flushing  The sites were too far  apart that I did did not feel comfortable attempting primary closure with this.  The finger was dressed with Xeroform and a bulky dressing.  Patient will be started on antibiotics and referred to Dr. Leafy Kindle with hand surgery  I have spoken to Dr. Candie Echevaria regarding this patient's care and we discussed patient's history, clinical presentation, physical exam, and my initial diagnosis and plan for care management.  Patient remained stable in the emergency room is appropriate for outpatient follow-up and discharged home.     Final Diagnosis     Final diagnoses:   Avulsion of finger, initial encounter          Disposition   Discharged to home       Medication List        START taking these medications      acetaminophen 500 MG tablet  Commonly known as: TYLENOL  Take 2 tablets by mouth 4 times daily as needed for Pain     ibuprofen 600 MG tablet  Commonly known as: ADVIL;MOTRIN  Take 1 tablet by mouth every 6 hours as needed for Pain     oxyCODONE 5 MG immediate release tablet  Commonly known as: Roxicodone  Take 1 tablet by mouth every 6 hours as needed for Pain for up to 3 days. Intended supply: 3 days. Take lowest dose possible to manage pain Max Daily Amount: 20 mg            ASK your doctor about these medications      Cimzia 2 X 200 MG/ML Pskt injection  Generic drug: certolizumab pegol               Where to Get Your Medications        These medications were sent to [facility] [id number] [facility], [place] - [address] - P [phone] Carmon Ginsberg [phone]  [address] [place], [place] [address]  Phone: [phone]   acetaminophen 500 MG tablet  ibuprofen 600 MG tablet  oxyCODONE 5 MG immediate release tablet         Type(s) of Personal Protection Equipment (PPE) worn during exam:  mask, gloves, eyeshield    Blima Rich, MPA, PA-C  08/26/2021  3:49 AM    The patient was personally evaluated by myself and d/w Dr. Despina Hick, MD who agrees with the above assessment and  plan.    My signature above authenticates this document and my orders, the final diagnosis(es), discharge prescription (s), and instructions in the Epic record. If you have any questions please contact [phone].     Nursing notes have been reviewed by the Physician/Advanced Practice Clinician. Dragon medical dictation software was used for portions of this report. Unintended voice recognition errors may occur.               Blima Rich, PA-C  08/27/21 [phone]

## 2021-08-27 ENCOUNTER — Inpatient Hospital Stay
Admit: 2021-08-27 | Discharge: 2021-08-27 | Disposition: A | Discharge status: Home / Self Care | Payer: PRIVATE HEALTH INSURANCE | Attending: Emergency Medicine

## 2021-08-27 MED ORDER — IBUPROFEN 600 MG PO TABS
600 MG | ORAL_TABLET | Freq: Four times a day (QID) | ORAL | 0 refills | Status: AC | PRN
Start: 2021-08-27 — End: ?

## 2021-08-27 MED ORDER — ACETAMINOPHEN 500 MG PO TABS
500 MG | ORAL_TABLET | Freq: Four times a day (QID) | ORAL | 0 refills | Status: AC | PRN
Start: 2021-08-27 — End: ?

## 2021-08-27 MED ORDER — CEPHALEXIN 500 MG PO CAPS
500 MG | ORAL | Status: AC
Start: 2021-08-27 — End: 2021-08-27
  Administered 2021-08-27: 04:00:00 500 mg via ORAL

## 2021-08-27 MED ORDER — OXYCODONE HCL 5 MG PO TABS
5 MG | ORAL_TABLET | Freq: Four times a day (QID) | ORAL | 0 refills | Status: AC | PRN
Start: 2021-08-27 — End: 2021-08-30

## 2021-08-27 NOTE — Discharge Instructions (Addendum)
Keep the finger clean and dry and covered.  You need to follow-up as soon as possible with Dr. Leafy Kindle who is the hand surgeon  Take the antibiotic as prescribed  Take Tylenol Motrin for mild to moderate pain.  I will send in some oxycodone for severe breakthrough pain.  You are not to drink alcohol or drive after taking oxycodone.      XR FINGER LEFT (MIN 2 VIEWS)   Final Result   FINDINGS/IMPRESSION: Soft tissue defect extending up to the distal tuft of the   3rd finger. No acute osseous abnormality.      Electronically signed by: Albin Felling, MD 08/26/2021 10:58 PM EDT

## 2021-08-27 NOTE — ED Notes (Signed)
12:29 AM  08/27/21     Discharge instructions given to patient (name) with verbalization of understanding. Patient accompanied by self.  Patient discharged with the following prescriptions Tylenol, Ibuprofen, oxycodone. Patient discharged to home (destination).      Beacher May, RN      Beacher May, RN  08/27/21 [phone]

## 2021-12-21 ENCOUNTER — Encounter: Payer: Self-pay | Admitting: *Deleted

## 2022-03-11 ENCOUNTER — Encounter: Payer: Self-pay | Admitting: *Deleted
